# Patient Record
Sex: Male | Born: 1995 | Race: Black or African American | Hispanic: No | Marital: Single | Smoking: Never smoker
Health system: Southern US, Community
[De-identification: ages and names within clinical notes are randomized; demographics above are authoritative.]

## PROBLEM LIST (undated history)

## (undated) ENCOUNTER — Emergency Department (HOSPITAL_BASED_OUTPATIENT_CLINIC_OR_DEPARTMENT_OTHER): Admission: EM | Payer: Medicaid Other | Source: Home / Self Care

---
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
## 2016-07-06 ENCOUNTER — Encounter (HOSPITAL_BASED_OUTPATIENT_CLINIC_OR_DEPARTMENT_OTHER): Payer: Self-pay

## 2016-07-06 ENCOUNTER — Emergency Department (HOSPITAL_BASED_OUTPATIENT_CLINIC_OR_DEPARTMENT_OTHER)
Admission: EM | Admit: 2016-07-06 | Discharge: 2016-07-06 | Disposition: A | Discharge status: Home / Self Care | Payer: Self-pay | Attending: Emergency Medicine | Admitting: Emergency Medicine

## 2016-07-06 ENCOUNTER — Emergency Department (HOSPITAL_BASED_OUTPATIENT_CLINIC_OR_DEPARTMENT_OTHER): Payer: Self-pay

## 2016-07-06 DIAGNOSIS — G8929 Other chronic pain: Secondary | ICD-10-CM | POA: Insufficient documentation

## 2016-07-06 DIAGNOSIS — M25511 Pain in right shoulder: Secondary | ICD-10-CM | POA: Insufficient documentation

## 2016-07-06 NOTE — ED Triage Notes (Signed)
C/o cont'd right shoulder pain r/t WC injury/ claim from Jun 15 2016-pt NAD-steady gait

## 2016-07-06 NOTE — ED Provider Notes (Signed)
[facility]   CSN: [id number] Arrival date & time: 07/06/16  1407     History   Chief Complaint Chief Complaint  Patient presents with  . Shoulder Pain    HPI Milinda Caveyler Laible is a 20 y.o. male.  HPI  20 y.o. male presents to the Emergency Department today complaining of right shoulder pain since Nov. 15th. Notes being at work and reaching for heavy machinery when he felt a pulling sensation in his arm. Went to UC that day and dx with sprain. No dislocation. No fracture. Since then, pt has had persistent shoulder pain with movement. No pain at rest. No numbness/tingling. No fevers. OTC with minimal relief. Pt presents with mother due to delay in seeing [facility]CP [facility]. No other symptoms noted.     History reviewed. No pertinent past medical history.  There are no active problems to display for this patient.   History reviewed. No pertinent surgical history.     Home Medications    Prior to Admission medications   Not on File    Family History No family history on file.  Social History Social History  Substance Use Topics  . Smoking status: Never Smoker  . Smokeless tobacco: Never Used  . Alcohol use No     Allergies   Patient has no known allergies.   Review of Systems Review of Systems  Constitutional: Negative for fever.  Gastrointestinal: Negative for nausea and vomiting.  Musculoskeletal: Positive for arthralgias.   Physical Exam Updated Vital Signs BP 129/90 (BP Location: Left Arm)   Pulse 62   Temp 97.1 F (36.2 C) (Oral)   Resp 18   Ht 5\' 7"  (1.702 m)   Wt 77.8 kg   SpO2 100%   BMI 26.85 kg/m   Physical Exam  Constitutional: He is oriented to person, place, and time. Vital signs are normal. He appears well-developed and well-nourished.  HENT:  Head: Normocephalic.  Right Ear: Hearing normal.  Left Ear: Hearing normal.  Eyes: Conjunctivae and EOM are normal. Pupils are equal, round, and reactive to  light.  Neck: Normal range of motion. Neck supple.  Cardiovascular: Normal rate and regular rhythm.   Pulmonary/Chest: Effort normal.  Musculoskeletal:  Right Shoulder Pain on passive internal/external rotation. Positive Neers. Pt able to actively ROM without difficulty, although it is painful. No crepitus. NVI. Distal pulses appreciated.  Neurological: He is alert and oriented to person, place, and time.  Skin: Skin is warm and dry.  Psychiatric: He has a normal mood and affect. His speech is normal and behavior is normal. Thought content normal.  Nursing note and vitals reviewed.  ED Treatments / Results  Labs (all labs ordered are listed, but only abnormal results are displayed) Labs Reviewed - No data to display  EKG  EKG Interpretation None      Radiology No results found.  Procedures Procedures (including critical care time)  Medications Ordered in ED Medications - No data to display   Initial Impression / Assessment and Plan / ED Course  I have reviewed the triage vital signs and the nursing notes.  Pertinent labs & imaging results that were available during my care of the patient were reviewed by me and considered in my medical decision making (see chart for details).  Clinical Course    Final Clinical Impressions(s) / ED Diagnoses   {I have reviewed and evaluated the relevant imaging studies.  {I have reviewed the relevant previous healthcare records.  {  I obtained HPI from historian.   ED Course:  Assessment: Patient X-Ray negative for obvious fracture or dislocation. Likely rotator cuff sprain. Pt advised to follow up with orthopedics. Patient with shoulder sling at home, conservative therapy recommended and discussed. Patient will be discharged home & is agreeable with above plan. Returns precautions discussed. Pt appears safe for discharge.  Disposition/Plan:  DC Home Additional Verbal discharge instructions given and discussed with patient.  Pt  Instructed to f/u with PCP in the next week for evaluation and treatment of symptoms. Return precautions given Pt acknowledges and agrees with plan  Supervising Physician Vanetta MuldersScott Zackowski, MD  Final diagnoses:  Chronic right shoulder pain    New Prescriptions New Prescriptions   No medications on file     Audry Piliyler Kalev Temme, PA-C 07/06/16 1555    Loren Raceravid Yelverton, MD 07/09/16 [phone]

## 2016-07-06 NOTE — ED Notes (Signed)
Family at bedside. 

## 2016-07-06 NOTE — Discharge Instructions (Signed)
Please read and follow all provided instructions.  Your diagnoses today include:  1. Chronic right shoulder pain     Tests performed today include: Vital signs. See below for your results today.   Medications prescribed:  Take as prescribed   Home care instructions:  Follow any educational materials contained in this packet.  Follow-up instructions: Please follow-up with Orthopedics for further evaluation of symptoms and treatment   Return instructions:  Please return to the Emergency Department if you do not get better, if you get worse, or new symptoms OR  - Fever (temperature greater than 101.8F)  - Bleeding that does not stop with holding pressure to the area    -Severe pain (please note that you may be more sore the day after your accident)  - Chest Pain  - Difficulty breathing  - Severe nausea or vomiting  - Inability to tolerate food and liquids  - Passing out  - Skin becoming red around your wounds  - Change in mental status (confusion or lethargy)  - New numbness or weakness    Please return if you have any other emergent concerns.  Additional Information:  Your vital signs today were: BP 129/90 (BP Location: Left Arm)    Pulse 62    Temp 97.1 F (36.2 C) (Oral)    Resp 18    Ht 5\' 7"  (1.702 m)    Wt 77.8 kg    SpO2 100%    BMI 26.85 kg/m  If your blood pressure (BP) was elevated above 135/85 this visit, please have this repeated by your doctor within one month. ---------------

## 2016-07-08 ENCOUNTER — Encounter: Payer: Self-pay | Admitting: Family Medicine

## 2016-07-08 ENCOUNTER — Ambulatory Visit (INDEPENDENT_AMBULATORY_CARE_PROVIDER_SITE_OTHER): Payer: Self-pay | Admitting: Family Medicine

## 2016-07-08 VITALS — BP 127/81 | HR 86 | Ht 67.0 in | Wt 171.0 lb

## 2016-07-08 DIAGNOSIS — S4991XA Unspecified injury of right shoulder and upper arm, initial encounter: Secondary | ICD-10-CM

## 2016-07-08 NOTE — Patient Instructions (Addendum)
You have a shoulder subluxation (near dislocation) - this leads to rotator cuff strain/spasms and possible labral tear. Start physical therapy and do home exercises on days you don't go to therapy. Icing 15 minutes at a time 3-4 times a day. Ibuprofen 600mg  three times a day with food OR aleve 2 tabs twice a day with food for pain and inflammation. Follow up with me on or after December 27th. If not improving would go ahead with MR arthrogram of this shoulder.

## 2016-07-11 DIAGNOSIS — S4991XD Unspecified injury of right shoulder and upper arm, subsequent encounter: Secondary | ICD-10-CM | POA: Insufficient documentation

## 2016-07-11 NOTE — Assessment & Plan Note (Signed)
sustained when reaching inferiorly at work.  Consistent with shoulder subluxation leading to rotator cuff strain, possible labral tear based on exam.  Start physical therapy, home exercises, icing.  Ibuprofen or aleve if needed.  F/u when 6 weeks out from injury.  Consider MR arthrogram if not improving.

## 2016-07-11 NOTE — Progress Notes (Signed)
PCP: Pcp Not In System  Subjective:   HPI: Patient is a 20 y.o. male here for right shoulder injury.  Patient reports on 11/15 while at work he was reaching downwards for a tool and felt a pop in right shoulder. Arm went numb especially around shoulder area. Since then has had pain with all movements of the shoulder. Pain level 4/10, sharp. No prior issues with this shoulder. Is right handed. Went to 1-2 visits of PT before workers comp denied this. No skin changes, current numbness. No swelling or bruising.  No past medical history on file.  No current outpatient prescriptions on file prior to visit.   No current facility-administered medications on file prior to visit.     No past surgical history on file.  No Known Allergies  Social History   Social History  . Marital status: Single    Spouse name: N/A  . Number of children: N/A  . Years of education: N/A   Occupational History  . Not on file.   Social History Main Topics  . Smoking status: Never Smoker  . Smokeless tobacco: Never Used  . Alcohol use No  . Drug use: No  . Sexual activity: Not on file   Other Topics Concern  . Not on file   Social History Narrative  . No narrative on file    No family history on file.  BP 127/81   Pulse 86   Ht 5\' 7"  (1.702 m)   Wt 171 lb (77.6 kg)   BMI 26.78 kg/m   Review of Systems: See HPI above.     Objective:  Physical Exam:  Gen: NAD, comfortable in exam room  Right shoulder: No swelling, ecchymoses.  No gross deformity. Mild anterior and posterior shoulder tenderness. Full ER.  Flex and abduct to 90 degrees actively, painful arc. Positive Hawkins, Neers. Negative Yergasons. Strength 5/5 with empty can and resisted internal/external rotation.  Pain all motions. Mild pain with apprehension. Negative sulcus. Positive o'briens. NV intact distally.  Left shoulder: FROM without pain   Assessment & Plan:  1. Right shoulder injury - sustained  when reaching inferiorly at work.  Consistent with shoulder subluxation leading to rotator cuff strain, possible labral tear based on exam.  Start physical therapy, home exercises, icing.  Ibuprofen or aleve if needed.  F/u when 6 weeks out from injury.  Consider MR arthrogram if not improving.

## 2016-07-15 ENCOUNTER — Ambulatory Visit: Payer: Medicaid Other | Attending: Family Medicine | Admitting: Physical Therapy

## 2016-07-15 DIAGNOSIS — M25511 Pain in right shoulder: Secondary | ICD-10-CM | POA: Insufficient documentation

## 2016-07-15 DIAGNOSIS — M6281 Muscle weakness (generalized): Secondary | ICD-10-CM

## 2016-07-15 DIAGNOSIS — M25611 Stiffness of right shoulder, not elsewhere classified: Secondary | ICD-10-CM | POA: Insufficient documentation

## 2016-07-15 DIAGNOSIS — R293 Abnormal posture: Secondary | ICD-10-CM

## 2016-07-15 NOTE — Therapy (Signed)
[facility] [place] [address]  [address] [place], [place], [address] Phone: [phone]   Fax:  [phone]  Physical Therapy Evaluation  Patient Details  Name: Joel Martinez MRN: [id number] Date of Birth: 09/02/1995 Referring Provider: Dr. Norton BlizzardShane Hudnall  Encounter Date: 07/15/2016      PT End of Session - 07/15/16 1159    Visit Number 1   Number of Visits 16   Date for PT Re-Evaluation 09/16/16   PT Start Time 1015   PT Stop Time 1058   PT Time Calculation (min) 43 min   Activity Tolerance Patient tolerated treatment well   Behavior [facility] Therapy [facility] for tasks assessed/performed      No past medical history on file.  No past surgical history on file.  There were no vitals filed for this visit.       Subjective Assessment - 07/15/16 1017    Subjective Patient reporting R shoulder pain. Patient was reaching for drill at work - felt pop, was unable to then lift drill. Patient denies numbness and tingling into R UE. Patient reporitng pain with all active movements. Dr. Pearletha ForgeHudnall has given patient theraband and exericses, which patient has been complaint with. Issue has been going on for approx 1 month.    Patient is accompained by: Family member   Limitations Lifting   Diagnostic tests xray - patient unsure of results   Patient Stated Goals get shoulder stronger, reduce pain   Currently in Pain? Yes   Pain Score 4    Pain Location Shoulder   Pain Orientation Right   Pain Descriptors / Indicators Aching;Tightness   Pain Type Acute pain   Pain Onset 1 to 4 weeks ago   Pain Frequency Intermittent   Aggravating Factors  reaching - all directions, sleeping on R shoulder, lifting   Pain Relieving Factors heat   Effect of Pain on Daily Activities unable to work            [facility] PT Assessment - 07/15/16 0001      Assessment   Medical Diagnosis R shoulder injury - possible subluxation   Referring Provider Dr. Norton BlizzardShane  Hudnall   Onset Date/Surgical Date 06/15/16   Hand Dominance Right   Next MD Visit 08/03/16   Prior Therapy yes - shoulder     Balance Screen   Has the patient fallen in the past 6 months No   Has the patient had a decrease in activity level because of a fear of falling?  No   Is the patient reluctant to leave their home because of a fear of falling?  No     Home [employer]   Living Environment Private residence   Living Arrangements Parent   Type of Home Apartment     Prior Function   Level of Independence Independent   Vocation Full time employment   Vocation Requirements drills, heavy use of UE   Leisure video games     Cognition   Overall Cognitive Status Within Functional Limits for tasks assessed     Observation/Other Assessments   Focus on Therapeutic Outcomes (FOTO)  Shoulder: 47 (53% limited, predicted 28% limited)     Posture/Postural Control   Posture/Postural Control Postural limitations   Postural Limitations Rounded Shoulders;Forward head     ROM / Strength   AROM / PROM / Strength AROM;PROM;Strength     AROM   Overall AROM Comments L shoulder Full AROM with no pain   AROM Assessment  Site Shoulder   Right/Left Shoulder Right   Right Shoulder Flexion 75 Degrees   Right Shoulder ABduction 93 Degrees   Right Shoulder Internal Rotation --  functional to approx T8-T10   Right Shoulder External Rotation --  Functional to R UT     PROM   PROM Assessment Site Shoulder   Right/Left Shoulder Right   Right Shoulder Flexion 148 Degrees   Right Shoulder ABduction 139 Degrees   Right Shoulder Internal Rotation 79 Degrees   Right Shoulder External Rotation 82 Degrees     Strength   Strength Assessment Site Shoulder;Elbow   Right/Left Shoulder Right;Left   Right Shoulder Flexion 3+/5   Right Shoulder ABduction 3+/5   Right Shoulder Internal Rotation 3+/5   Right Shoulder External Rotation 3/5   Left Shoulder Flexion 5/5   Left Shoulder ABduction 5/5   Left  Shoulder Internal Rotation 5/5   Left Shoulder External Rotation 5/5   Right/Left Elbow Right;Left   Right Elbow Flexion 3+/5   Right Elbow Extension 4-/5   Left Elbow Flexion 5/5   Left Elbow Extension 5/5     Special Tests    Special Tests Rotator Cuff Impingement;Laxity/Instability Tests;Biceps/Labral Tests   Rotator Cuff Impingment tests Leanord AsalHawkins- Kennedy test;Empty Can test;Painful Arc of Motion   Laxity/Instability  Sulcus sign test at 0 degrees   Biceps/Labral tests Speeds Test     Hawkins-Kennedy test   Findings Positive   Side Right     Empty Can test   Findings Negative   Side Right     Painful Arc of Motion   Findings Positive   Side Right     Sulcus Sign At 0 Degrees   Findings Negative   Side Right     O'Brien's Test   Findings Positive   Side Right     Speeds test   findings Positive   Side Right                           PT Education - 07/15/16 1158    Education provided Yes   Education Details exam findins, POC, HEP   Person(s) Educated Patient   Methods Explanation;Demonstration;Handout   Comprehension Verbalized understanding;Returned demonstration;Need further instruction          PT Short Term Goals - 07/15/16 1206      PT SHORT TERM GOAL #1   Title Patient to be independent with HEP (08/19/16)   Status New     PT SHORT TERM GOAL #2   Title Patient to improve R shoulder PROM equal to that of L shoulder without pain (08/19/16)           PT Long Term Goals - 07/15/16 1205      PT LONG TERM GOAL #1   Title Patient to be independent with advanced HEP (09/16/16)   Status New     PT LONG TERM GOAL #2   Title Patient to demonstrate AROM of R shoulder equal to that of L without an increase in pain (09/16/16)   Status New     PT LONG TERM GOAL #3   Title Patient to improve R shoulder/elbow strength to >/= 4+/5 with no increase in pain to allow for return to work (09/16/16)   Status New     PT LONG TERM GOAL #4    Title Patient to demonstrate proper postural alignment with neutral spine for improed biomechanics of R shoulder complex (09/16/16)   Status  New               Plan - 07/15/16 1159    Clinical Impression Statement Patient is a 20 y/o male presenting to OPPT today for low complexity eval regarding R shoulder pain. Patient reporting that he was at work when he reached down for an object, and heard/felt a "pop" in his shoulder; immediate onset of pain, numbness, and inability to lift. Patient today demonstrating poor posture, as well as reduced AROM and PROM with pain limiting as well as reduced strength of R shoulder, however patient with give-way weakness with all MMT. Patient with positive test for impingement as well as postive test for biceps/labral involvement, however, difficult to truly assess due to patients pain during evaluation. Patient heavily guarded with all passive motions of R shoulder with required VC to allow for passive movement. Patient to benefit from skilled PT intervention to addess the above listed deficits to alloe for improved functional use of R UE.    Rehab Potential Good   PT Frequency 2x / week   PT Duration 8 weeks   PT Treatment/Interventions ADLs/Self Care Home Management;Cryotherapy;Electrical Stimulation;Moist Heat;Ultrasound;Therapeutic exercise;Therapeutic activities;Patient/family education;Manual techniques;Passive range of motion;Vasopneumatic Device;Taping;Dry needling   Consulted and Agree with Plan of Care Patient      Patient will benefit from skilled therapeutic intervention in order to improve the following deficits and impairments:  Decreased activity tolerance, Decreased range of motion, Decreased strength, Improper body mechanics, Increased muscle spasms, Increased fascial restricitons, Impaired UE functional use, Pain  Visit Diagnosis: Acute pain of right shoulder - Plan: PT plan of care cert/re-cert  Stiffness of right shoulder, not elsewhere  classified - Plan: PT plan of care cert/re-cert  Abnormal posture - Plan: PT plan of care cert/re-cert  Muscle weakness (generalized) - Plan: PT plan of care cert/re-cert     Problem List Patient Active Problem List   Diagnosis Date Noted  . Right shoulder injury, initial encounter 07/11/2016      Kipp Laurence, PT, DPT 07/15/16 12:11 PM    [facility] [address]  [address] [place], [place], [address] Phone: [phone]   Fax:  [phone]  Name: Bowie Delia MRN: [id number] Date of Birth: August 18, 1995

## 2016-07-15 NOTE — Patient Instructions (Signed)
Cane Exercise: Flexion    Lie on back, holding cane above chest. Keeping arms as straight as possible, lower cane toward floor beyond head. Hold __5-10__ seconds. Repeat __15__ times. Do _2___ sessions per day.    Abduction (Eccentric) - Active (Cane)    Lift cane out to side with unaffected arm. Avoid hiking shoulder. Keep palm relaxed. Slowly lower affected arm for 3-5 seconds. _15__ reps per set, __2_ sets per day. Hold 5-10 seconds at end range     SHOULDER: External Rotation - Supine (Cane)    Hold cane with both hands. Rotate arm away from body. Keep elbow on floor and next to body. _15__ reps per set, _2__ sets per day. Hold at end range 5-10 seconds      Scapular Retraction (Standing)    With arms at sides, pinch shoulder blades together. Repeat __15__ times per set. Do _3_ sets per session.     Flexibility: Upper Trapezius Stretch    Gently grasp right side of head while reaching behind back with other hand. Tilt head away until a gentle stretch is felt. Hold _30___ seconds. Repeat __3__ times per set. Do 2 times a day.

## 2016-07-22 ENCOUNTER — Ambulatory Visit: Payer: Medicaid Other | Admitting: Physical Therapy

## 2016-07-22 DIAGNOSIS — M25511 Pain in right shoulder: Secondary | ICD-10-CM

## 2016-07-22 DIAGNOSIS — R293 Abnormal posture: Secondary | ICD-10-CM

## 2016-07-22 DIAGNOSIS — M25611 Stiffness of right shoulder, not elsewhere classified: Secondary | ICD-10-CM

## 2016-07-22 DIAGNOSIS — M6281 Muscle weakness (generalized): Secondary | ICD-10-CM

## 2016-07-22 NOTE — Patient Instructions (Signed)
Horizontal Abduction (Resistive Band)    With arms at shoulder level, keep elbows straight. Using other arm as anchor, pull involved arm outward. Hold __3-5__ seconds. Repeat _15___ times. Band in hands (not on elbows)    External Rotation (Eccentric), (Resistance Band)    Quickly pull band outward with forearm of affected arm. Keep elbows at sides, wrists neutral. Slowly return to start for 3-5 seconds. Use ___red_____ resistance band.  _15__ reps per set ** Thumbs point out - rotate shoulders nad squeeze shoulder blades    Resistive Band Rowing    With resistive band anchored in door, grasp both ends. Keeping elbows bent, pull back, squeezing shoulder blades together. Hold __3-5__ seconds. Repeat __15__ times.  Squeeze shoulder blades   Arm: Internal Rotation    Redband in door - elbow by side - rotate palm into stomach x 15 reps    Arm: External Rotation    Red band in door - elbow by side - rotate shoulder out x 15 reps

## 2016-07-22 NOTE — Therapy (Signed)
[facility] [address]  [address] [place], [place], [address] Phone: [phone]   Fax:  [phone]  Physical Therapy Treatment  Patient Details  Name: Joel Martinez MRN: [id number] Date of Birth: 04/08/1996 Referring Provider: Dr. Norton BlizzardShane Hudnall  Encounter Date: 07/22/2016      PT End of Session - 07/22/16 1021    Visit Number 2   Number of Visits 17   Authorization Type Medicaid   Authorization Time Period 07/22/16 - 09/15/16   Authorization - Visit Number 1   Authorization - Number of Visits 16   PT Start Time 1018   PT Stop Time 1101   PT Time Calculation (min) 43 min   Activity Tolerance Patient tolerated treatment well   Behavior During Therapy [facility] for tasks assessed/performed      No past medical history on file.  No past surgical history on file.  There were no vitals filed for this visit.      Subjective Assessment - 07/22/16 1022    Subjective Patient reports good compliance with HEP - feels like he can move arm a little more.   Patient is accompained by: Family member   Diagnostic tests xray - patient unsure of results   Patient Stated Goals get shoulder stronger, reduce pain   Currently in Pain? Yes   Pain Score 2    Pain Location Shoulder   Pain Orientation Right   Pain Descriptors / Indicators Sore   Pain Type Acute pain   Pain Onset 1 to 4 weeks ago   Pain Frequency Intermittent   Aggravating Factors  reaching - all directions, sleeping on R shoulder, lifting   Pain Relieving Factors heat   Effect of Pain on Daily Activities unable to work                         [facility] - 07/22/16 1024      Exercises   Exercises Shoulder     Shoulder Exercises: Supine   Horizontal ABduction AROM;Both;10 reps;Theraband   Theraband Level (Shoulder Horizontal ABduction) Level 2 (Red)   External Rotation Strengthening;Both;10 reps;Theraband   Theraband Level  (Shoulder External Rotation) Level 2 (Red)   External Rotation Limitations with scap squeeze     Shoulder Exercises: Sidelying   External Rotation Strengthening;Right;10 reps;Weights   External Rotation Weight (lbs) 2   External Rotation Limitations 2 sets   ABduction Strengthening;Right;10 reps;Weights   ABduction Weight (lbs) 2   ABduction Limitations 2 sests     Shoulder Exercises: Standing   External Rotation Strengthening;Right;15 reps;Theraband   Theraband Level (Shoulder External Rotation) Level 2 (Red)   Internal Rotation Strengthening;Both;15 reps;Theraband   Theraband Level (Shoulder Internal Rotation) Level 2 (Red)   Row Strengthening;Both;15 reps;Theraband   Theraband Level (Shoulder Row) Level 2 (Red)     Shoulder Exercises: ROM/Strengthening   UBE (Upper Arm Bike) level 2.5 x 6 minutes (3/3)     Manual Therapy   Manual Therapy Passive ROM   Passive ROM PROM of R shoulder into flexion, abduction, ER, and IR with near full motion in all planes - slight anterior R shoulder pain with all stretching                PT Education - 07/22/16 1128    Education provided Yes   Education Details HEP progression   Person(s) Educated Patient   Methods Explanation;Demonstration;Handout   Comprehension Verbalized understanding;Returned demonstration;Need further  instruction          PT Short Term Goals - 07/15/16 1206      PT SHORT TERM GOAL #1   Title Patient to be independent with HEP (08/19/16)   Status New     PT SHORT TERM GOAL #2   Title Patient to improve R shoulder PROM equal to that of L shoulder without pain (08/19/16)           PT Long Term Goals - 07/15/16 1205      PT LONG TERM GOAL #1   Title Patient to be independent with advanced HEP (09/16/16)   Status New     PT LONG TERM GOAL #2   Title Patient to demonstrate AROM of R shoulder equal to that of L without an increase in pain (09/16/16)   Status New     PT LONG TERM GOAL #3   Title  Patient to improve R shoulder/elbow strength to >/= 4+/5 with no increase in pain to allow for return to work (09/16/16)   Status New     PT LONG TERM GOAL #4   Title Patient to demonstrate proper postural alignment with neutral spine for improed biomechanics of R shoulder complex (09/16/16)   Status New               Plan - 07/22/16 1024    Clinical Impression Statement Patient today with subjective reports of feeling better since last visit with improved ability to move shoulder and have functional use of that shoulder - such as putting on hoodie. Patient today with near full PROM of R shoulder in all planes with slight anterior shoulder pain with overpressure that quickly resolves. Patient initiating more strengthening tasks today with ability to perform all activities with no increase in pain. Patient to continue to benefit from PT to maximize functional use of R UE.    PT Treatment/Interventions ADLs/Self Care Home Management;Cryotherapy;Electrical Stimulation;Moist Heat;Ultrasound;Therapeutic exercise;Therapeutic activities;Patient/family education;Manual techniques;Passive range of motion;Vasopneumatic Device;Taping;Dry needling   Consulted and Agree with Plan of Care Patient      Patient will benefit from skilled therapeutic intervention in order to improve the following deficits and impairments:  Decreased activity tolerance, Decreased range of motion, Decreased strength, Improper body mechanics, Increased muscle spasms, Increased fascial restricitons, Impaired UE functional use, Pain  Visit Diagnosis: Acute pain of right shoulder  Stiffness of right shoulder, not elsewhere classified  Abnormal posture  Muscle weakness (generalized)     Problem List Patient Active Problem List   Diagnosis Date Noted  . Right shoulder injury, initial encounter 07/11/2016      Kipp LaurenceStephanie R Aaron, PT, DPT 07/22/16 11:32 AM    [facility]  [place] [address]  [address] [place], [place], [address] Phone: [phone]   Fax:  [phone]  Name: Joel Martinez MRN: [id number] Date of Birth: 06/12/1996

## 2016-07-27 ENCOUNTER — Ambulatory Visit: Payer: Medicaid Other

## 2016-07-27 DIAGNOSIS — R293 Abnormal posture: Secondary | ICD-10-CM

## 2016-07-27 DIAGNOSIS — M25611 Stiffness of right shoulder, not elsewhere classified: Secondary | ICD-10-CM

## 2016-07-27 DIAGNOSIS — M25511 Pain in right shoulder: Secondary | ICD-10-CM

## 2016-07-27 DIAGNOSIS — M6281 Muscle weakness (generalized): Secondary | ICD-10-CM

## 2016-07-27 NOTE — Therapy (Signed)
[facility] [address]  [address] [place], [place], [address] Phone: [phone]   Fax:  [phone]  Physical Therapy Treatment  Patient Details  Name: Joel Martinez Guard MRN: [id number] Date of Birth: 06/17/1996 Referring Provider: Dr. Norton BlizzardShane Hudnall  Encounter Date: 07/27/2016      PT End of Session - 07/27/16 0948    Visit Number 3   Number of Visits 17   Authorization Type Medicaid   Authorization Time Period 07/22/16 - 09/15/16   Authorization - Visit Number 2   Authorization - Number of Visits 16   PT Start Time 0945  Pt. arriving 15 min late to appointment    PT Stop Time 1015   PT Time Calculation (min) 30 min   Activity Tolerance Patient tolerated treatment well   Behavior During Therapy [facility] for tasks assessed/performed      No past medical history on file.  No past surgical history on file.  There were no vitals filed for this visit.      Subjective Assessment - 07/27/16 0947    Subjective Pt. reporting R shoulder mildly sore following last treatment.     Patient Stated Goals get shoulder stronger, reduce pain   Currently in Pain? No/denies   Pain Score 0-No pain   Multiple Pain Sites No            [facility] Adult PT Treatment/Exercise - 07/27/16 0954      Shoulder Exercises: Supine   Other Supine Exercises All wand activities AAROM flexion, abduction, ER reviewed with 5 reps to check for proper technique  pt. requiring some verbal cueing for proper technique withER   Other Supine Exercises --     Shoulder Exercises: Sidelying   External Rotation Strengthening;Right;Weights;20 reps   External Rotation Weight (lbs) 2   External Rotation Limitations 1 set    ABduction Strengthening;Right;Weights;15 reps   ABduction Weight (lbs) 2   ABduction Limitations 1 sets    Other Sidelying Exercises 3# shoulder circles CW, CCW x 20 reps each way      Shoulder Exercises: Standing   External Rotation  Strengthening;Right;15 reps;Theraband   Theraband Level (Shoulder External Rotation) Level 3 (Green)   Internal Rotation Strengthening;Both;15 reps;Theraband   Theraband Level (Shoulder Internal Rotation) Level 3 (Green)   Row Strengthening;Both;15 reps;Theraband  5" hold    Theraband Level (Shoulder Row) Level 4 (Blue)                  PT Short Term Goals - 07/27/16 0949      PT SHORT TERM GOAL #1   Title Patient to be independent with HEP (08/19/16)   Status On-going     PT SHORT TERM GOAL #2   Title Patient to improve R shoulder PROM equal to that of L shoulder without pain (08/19/16)           PT Long Term Goals - 07/27/16 0949      PT LONG TERM GOAL #1   Title Patient to be independent with advanced HEP (09/16/16)   Status On-going     PT LONG TERM GOAL #2   Title Patient to demonstrate AROM of R shoulder equal to that of L without an increase in pain (09/16/16)   Status On-going     PT LONG TERM GOAL #3   Title Patient to improve R shoulder/elbow strength to >/= 4+/5 with no increase in pain to allow for return to work (09/16/16)   Status On-going  PT LONG TERM GOAL #4   Title Patient to demonstrate proper postural alignment with neutral spine for improed biomechanics of R shoulder complex (09/16/16)   Status On-going               Plan - 07/27/16 [id number]    Clinical Impression Statement Pt. arriving 15 min late to therapy today thus treatment abbreviated.  Strengthening therex mildy progressed today with pt. tolerating this well.  Pt. reporting 5/10 pain with green TB resisted shoulder ER however pt. motion presentation inconsistent with pain report.  Pt. only reporting pain today when questioned by therapist and failed to demonstrate appropriate muscular guarding or apprehension for pain reports.  Updated HEP reviewed with pt. today however poor technique with demonstration of ER/IR with band resistance.  Pt. requiring cueing for proper technique with  ER today and would benefit from future HEP review to improve technique and adherence.     PT Treatment/Interventions ADLs/Self Care Home Management;Cryotherapy;Electrical Stimulation;Moist Heat;Ultrasound;Therapeutic exercise;Therapeutic activities;Patient/family education;Manual techniques;Passive range of motion;Vasopneumatic Device;Taping;Dry needling      Patient will benefit from skilled therapeutic intervention in order to improve the following deficits and impairments:  Decreased activity tolerance, Decreased range of motion, Decreased strength, Improper body mechanics, Increased muscle spasms, Increased fascial restricitons, Impaired UE functional use, Pain  Visit Diagnosis: Acute pain of right shoulder  Stiffness of right shoulder, not elsewhere classified  Abnormal posture  Muscle weakness (generalized)     Problem List Patient Active Problem List   Diagnosis Date Noted  . Right shoulder injury, initial encounter 07/11/2016    Kermit BaloMicah Kaylena Pacifico, PTA 07/27/16 11:37 AM  [facility] [place] [address]  [address] [place], [place], [address] Phone: [phone]   Fax:  [phone]  Name: Joel Martinez Greenspan MRN: [id number] Date of Birth: 09/04/1995

## 2016-08-03 ENCOUNTER — Ambulatory Visit: Payer: Medicaid Other | Attending: Family Medicine | Admitting: Physical Therapy

## 2016-08-03 ENCOUNTER — Ambulatory Visit (INDEPENDENT_AMBULATORY_CARE_PROVIDER_SITE_OTHER): Payer: Self-pay | Admitting: Family Medicine

## 2016-08-03 ENCOUNTER — Encounter: Payer: Self-pay | Admitting: Family Medicine

## 2016-08-03 DIAGNOSIS — M25611 Stiffness of right shoulder, not elsewhere classified: Secondary | ICD-10-CM | POA: Insufficient documentation

## 2016-08-03 DIAGNOSIS — M25511 Pain in right shoulder: Secondary | ICD-10-CM | POA: Insufficient documentation

## 2016-08-03 DIAGNOSIS — R293 Abnormal posture: Secondary | ICD-10-CM | POA: Insufficient documentation

## 2016-08-03 DIAGNOSIS — S4991XD Unspecified injury of right shoulder and upper arm, subsequent encounter: Secondary | ICD-10-CM

## 2016-08-03 DIAGNOSIS — M6281 Muscle weakness (generalized): Secondary | ICD-10-CM | POA: Insufficient documentation

## 2016-08-03 NOTE — Therapy (Addendum)
[facility] [address]  [place], [place], [address] Phone: [phone]   Fax:  [phone]  Physical Therapy Treatment  Patient Details  Name: Joel Martinez MRN: [id number] Date of Birth: 10/13/1995 Referring Provider: Dr. Karlton Lemon  Encounter Date: 08/03/2016      PT End of Session - 08/03/16 1015    Visit Number 4   Number of Visits 17   Date for PT Re-Evaluation 09/16/16   Authorization Type Medicaid   Authorization Time Period 07/22/16 - 09/15/16   Authorization - Visit Number 3   Authorization - Number of Visits 16   PT Start Time 8786   PT Stop Time 1059   PT Time Calculation (min) 45 min   Activity Tolerance Patient tolerated treatment well   Behavior During Therapy [facility] for tasks assessed/performed      No past medical history on file.  No past surgical history on file.  There were no vitals filed for this visit.      Subjective Assessment - 08/03/16 1013    Subjective Patient reporting full motion of R shoulder - no pain. Has remained compliant with HEP   Patient is accompained by: Family member   Diagnostic tests xray - patient unsure of results   Patient Stated Goals get shoulder stronger, reduce pain   Currently in Pain? No/denies   Pain Score 0-No pain            [facility] PT Assessment - 08/03/16 0001      AROM   Overall AROM Comments AROM R shoulder equal to L - no pain     PROM   Overall PROM  --  PROM R shoulder equal to L - no pain     Strength   Strength Assessment Site Shoulder;Elbow   Right/Left Shoulder Right;Left   Right Shoulder ABduction 4+/5   Right Shoulder Internal Rotation 4+/5   Right Shoulder External Rotation 4/5   Left Shoulder Flexion 5/5   Left Shoulder ABduction 5/5   Left Shoulder Internal Rotation 5/5   Left Shoulder External Rotation 5/5   Right/Left Elbow Right;Left   Right Elbow Flexion 5/5   Right Elbow Extension 5/5   Left Elbow Flexion 5/5    Left Elbow Extension 5/5                     [facility] Adult PT Treatment/Exercise - 08/03/16 1016      Shoulder Exercises: Prone   Other Prone Exercises I's and T's on green physioball - 15 reps with 2#     Shoulder Exercises: Standing   External Rotation Strengthening;Right;15 reps;Theraband  1 set elbow by side; 1 set overhead   Theraband Level (Shoulder External Rotation) Level 3 (Green)   Internal Rotation Strengthening;Right;15 reps;Theraband   Theraband Level (Shoulder Internal Rotation) Level 3 (Green)  1 set elbow by side; 1 set overhead   Other Standing Exercises PNF D1/D2 - blue tband x 15 reps     Shoulder Exercises: ROM/Strengthening   UBE (Upper Arm Bike) level 5 x 6 minutes (3/3)   Cybex Row 15 reps   Cybex Row Limitations 35# - 15 reps at narrow grip   Rhythmic Stabilization, Supine patient supine with yellow medball - CW/CCW x 20; ABC's                   PT Short Term Goals - 08/03/16 1023      PT SHORT TERM GOAL #  1   Title Patient to be independent with HEP (08/19/16)   Status Achieved     PT SHORT TERM GOAL #2   Title Patient to improve R shoulder PROM equal to that of L shoulder without pain (08/19/16)   Status Achieved           PT Long Term Goals - 08/03/16 1023      PT LONG TERM GOAL #1   Title Patient to be independent with advanced HEP (09/16/16)   Status On-going     PT LONG TERM GOAL #2   Title Patient to demonstrate AROM of R shoulder equal to that of L without an increase in pain (09/16/16)   Status Achieved     PT LONG TERM GOAL #3   Title Patient to improve R shoulder/elbow strength to >/= 4+/5 with no increase in pain to allow for return to work (09/16/16)   Status On-going     PT LONG TERM GOAL #4   Title Patient to demonstrate proper postural alignment with neutral spine for improed biomechanics of R shoulder complex (09/16/16)   Status On-going               Plan - 08/03/16 1016    Clinical  Impression Statement Patient today with subjective reports of pain free motion of R shoulder into all planes with ability to return to all household tasks and ADLS. PROM and AROM of R shoulder equal to that of L with no pain in all directions. Patient performing all strengthening tasks of RTC and periscapular mm with no pain - some VC and TC required for proper form of movement to facilitate appropriate muscle response. Inclusion of overhead strengthening initiated today with good motion. Patient with MD follow-up today following treatment session to determine plan for return to work and further PT treatment.    PT Treatment/Interventions ADLs/Self Care Home Management;Cryotherapy;Electrical Stimulation;Moist Heat;Ultrasound;Therapeutic exercise;Therapeutic activities;Patient/family education;Manual techniques;Passive range of motion;Vasopneumatic Device;Taping;Dry needling   Consulted and Agree with Plan of Care Patient      Patient will benefit from skilled therapeutic intervention in order to improve the following deficits and impairments:  Decreased activity tolerance, Decreased range of motion, Decreased strength, Improper body mechanics, Increased muscle spasms, Increased fascial restricitons, Impaired UE functional use, Pain  Visit Diagnosis: Acute pain of right shoulder  Stiffness of right shoulder, not elsewhere classified  Abnormal posture  Muscle weakness (generalized)     Problem List Patient Active Problem List   Diagnosis Date Noted  . Right shoulder injury, initial encounter 07/11/2016    Lanney Gins, PT, DPT 08/03/16 11:06 AM  PHYSICAL THERAPY DISCHARGE SUMMARY  Visits from Start of Care: 4  Current functional level related to goals / functional outcomes: See above   Remaining deficits: See above   Education / Equipment: HEP  Plan: Patient agrees to discharge.  Patient goals were partially met. Patient is being discharged due to the physician's request.   ?????    Patient returning to MD for follow-up. Chart review from MD visit stating patient is able to transfer to HEP. Will see patient again in the future if PT services are needed.  Lanney Gins, PT, DPT 08/19/16 10:36 AM   [facility] [address]  [place], [place], [address] Phone: [phone]   Fax:  [phone]  Name: Zakaree Mcclenahan MRN: [id number] Date of Birth: [birth year]

## 2016-08-03 NOTE — Patient Instructions (Signed)
Do the home exercises 2-3 times a week for 4-6 more weeks. Call me if you have any problems otherwise follow up as needed.

## 2016-08-04 NOTE — Progress Notes (Signed)
PCP: Pcp Not In System  Subjective:   HPI: Patient is a 21 y.o. male here for right shoulder injury.  07/08/16: Patient reports on 11/15 while at work he was reaching downwards for a tool and felt a pop in right shoulder. Arm went numb especially around shoulder area. Since then has had pain with all movements of the shoulder. Pain level 4/10, sharp. No prior issues with this shoulder. Is right handed. Went to 1-2 visits of PT before workers comp denied this. No skin changes, current numbness. No swelling or bruising.  08/03/16: Patient reports he is doing extremely well. Did physical therapy, still doing home exercises also. No pain. No skin changes, numbness. No swelling.  No past medical history on file.  No current outpatient prescriptions on file prior to visit.   No current facility-administered medications on file prior to visit.     No past surgical history on file.  No Known Allergies  Social History   Social History  . Marital status: Single    Spouse name: N/A  . Number of children: N/A  . Years of education: N/A   Occupational History  . Not on file.   Social History Main Topics  . Smoking status: Never Smoker  . Smokeless tobacco: Never Used  . Alcohol use No  . Drug use: No  . Sexual activity: Not on file   Other Topics Concern  . Not on file   Social History Narrative  . No narrative on file    No family history on file.  BP 111/78   Pulse 76   Ht 5\' 7"  (1.702 m)   Wt 171 lb (77.6 kg)   BMI 26.78 kg/m   Review of Systems: See HPI above.     Objective:  Physical Exam:  Gen: NAD, comfortable in exam room  Right shoulder: No swelling, ecchymoses.  No gross deformity. No tenderness. FROM  Negative Hawkins, Neers. Negative Yergasons. Strength 5/5 with empty can and resisted internal/external rotation without pain. Negative apprehension. Negative o'briens. NV intact distally.  Left shoulder: FROM without pain    Assessment & Plan:  1. Right shoulder injury - Consistent with shoulder subluxation leading to rotator cuff strain, possible labral tear.  Clinically resolved with physical therapy, home exercises.  Encouraged continuing home exercises for 4-6 more weeks.  Ibuprofen or aleve if needed for soreness.  F/u prn.

## 2016-08-04 NOTE — Assessment & Plan Note (Signed)
Consistent with shoulder subluxation leading to rotator cuff strain, possible labral tear.  Clinically resolved with physical therapy, home exercises.  Encouraged continuing home exercises for 4-6 more weeks.  Ibuprofen or aleve if needed for soreness.  F/u prn.

## 2016-08-05 ENCOUNTER — Ambulatory Visit: Payer: Medicaid Other | Admitting: Physical Therapy

## 2016-08-08 ENCOUNTER — Ambulatory Visit: Payer: Medicaid Other

## 2016-08-11 ENCOUNTER — Ambulatory Visit: Payer: Medicaid Other

## 2016-08-15 ENCOUNTER — Ambulatory Visit: Payer: Medicaid Other | Admitting: Physical Therapy

## 2016-08-18 ENCOUNTER — Ambulatory Visit: Payer: Medicaid Other

## 2016-09-15 ENCOUNTER — Encounter (HOSPITAL_BASED_OUTPATIENT_CLINIC_OR_DEPARTMENT_OTHER): Payer: Self-pay

## 2016-09-15 ENCOUNTER — Emergency Department (HOSPITAL_BASED_OUTPATIENT_CLINIC_OR_DEPARTMENT_OTHER)
Admission: EM | Admit: 2016-09-15 | Discharge: 2016-09-15 | Disposition: A | Discharge status: Home / Self Care | Payer: Medicaid Other | Attending: Emergency Medicine | Admitting: Emergency Medicine

## 2016-09-15 DIAGNOSIS — J020 Streptococcal pharyngitis: Secondary | ICD-10-CM | POA: Insufficient documentation

## 2016-09-15 LAB — RAPID STREP SCREEN (MED CTR MEBANE ONLY): Streptococcus, Group A Screen (Direct): POSITIVE — AB

## 2016-09-15 MED ORDER — ACETAMINOPHEN 325 MG PO TABS
650.0000 mg | ORAL_TABLET | Freq: Once | ORAL | Status: AC
Start: 2016-09-15 — End: 2016-09-15
  Administered 2016-09-15: 650 mg via ORAL
  Filled 2016-09-15: qty 2

## 2016-09-15 MED ORDER — PENICILLIN G BENZATHINE 1200000 UNIT/2ML IM SUSP
1.2000 10*6.[IU] | Freq: Once | INTRAMUSCULAR | Status: AC
  Administered 2016-09-15: 1.2 10*6.[IU] via INTRAMUSCULAR
  Filled 2016-09-15: qty 2

## 2016-09-15 MED ORDER — NAPROXEN 250 MG PO TABS: 250.0000 mg | ORAL_TABLET | Freq: Two times a day (BID) | ORAL | 0 refills | Status: AC

## 2016-09-15 MED ORDER — DEXAMETHASONE SODIUM PHOSPHATE 10 MG/ML IJ SOLN
10.0000 mg | Freq: Once | INTRAMUSCULAR | Status: AC
  Administered 2016-09-15: 10 mg via INTRAMUSCULAR
  Filled 2016-09-15: qty 1

## 2016-09-15 NOTE — ED Provider Notes (Signed)
[facility] Note   CSN: [id number] Arrival date & time: 09/15/16  1941   By signing my name below, I, Joel Martinez, attest that this documentation has been prepared under the direction and in the presence of Will Lessly Stigler, PA-C Electronically Signed: Soijett Martinez, ED Scribe. 09/15/16. 10:09 PM.  History   Chief Complaint Chief Complaint  Patient presents with  . Sore Throat    HPI Joel Martinez is a 21 y.o. male who presents to the Emergency Department complaining of sore throat onset yesterday. Pt reports associated painful swallowing and subjective fever. Pt hasn't tried any medications for the relief of his symptoms. Pt denies chills, cough, trouble swallowing, and any other symptoms.    The history is provided by the patient. No language interpreter was used.    History reviewed. No pertinent past medical history.  Patient Active Problem List   Diagnosis Date Noted  . Right shoulder injury, subsequent encounter 07/11/2016    History reviewed. No pertinent surgical history.     Home Medications    Prior to Admission medications   Medication Sig Start Date End Date Taking? Authorizing Provider  naproxen (NAPROSYN) 250 MG tablet Take 1 tablet (250 mg total) by mouth 2 (two) times daily with a meal. 09/15/16   Joel FarrierWilliam Derrin Currey, PA-C    Family History No family history on file.  Social History Social History  Substance Use Topics  . Smoking status: Never Smoker  . Smokeless tobacco: Never Used  . Alcohol use No     Allergies   Patient has no known allergies.   Review of Systems Review of Systems  Constitutional: Positive for fever (subjective). Negative for chills.  HENT: Positive for sore throat. Negative for trouble swallowing.        +Painful swallowing  Respiratory: Negative for cough.      Physical Exam Updated Vital Signs BP 118/72 (BP Location: Left Arm)   Pulse 72   Temp 97.9 F (36.6 C) (Oral)   Resp 18   Ht 5\' 6"   (1.676 m)   Wt 78.5 kg   SpO2 97%   BMI 27.92 kg/m   Physical Exam  Constitutional: He appears well-developed and well-nourished. No distress.  Non-toxic appearing.  HENT:  Head: Normocephalic and atraumatic.  Mouth/Throat: Uvula is midline and mucous membranes are normal. No trismus in the jaw. Oropharyngeal exudate and posterior oropharyngeal edema present.  Left TM occluded by cerumen. Right TM nl. Boggy nasal turbinates bilaterally. Bilateral tonsillar hypertrophy with exudate. Uvula midline without edema. No PTA. No trismus or drooling. Tongue protrusion nl.  Eyes: Conjunctivae are normal. Pupils are equal, round, and reactive to light. Right eye exhibits no discharge. Left eye exhibits no discharge.  Neck: Neck supple.  Cardiovascular: Normal rate, regular rhythm, normal heart sounds and intact distal pulses.   Pulmonary/Chest: Effort normal and breath sounds normal. No respiratory distress.  Lungs clear to ausculation bilaterally.   Abdominal: Soft. There is no tenderness.  Lymphadenopathy:    He has no cervical adenopathy.  Neurological: He is alert. Coordination normal.  Skin: Skin is warm and dry. No rash noted. He is not diaphoretic.  Psychiatric: He has a normal mood and affect. His behavior is normal.  Nursing note and vitals reviewed.    ED Treatments / Results  DIAGNOSTIC STUDIES: Oxygen Saturation is 97% on RA, nl by my interpretation.    COORDINATION OF CARE: 10:09 PM Discussed treatment plan with pt at bedside which includes decadron injection, penicillin injection,  and pt agreed to plan.   Labs (all labs ordered are listed, but only abnormal results are displayed) Labs Reviewed  RAPID STREP SCREEN (NOT AT [facility]) - Abnormal; Notable for the following:       Result Value   Streptococcus, Group A Screen (Direct) POSITIVE (*)    All other components within normal limits    Procedures Procedures (including critical care time)  Medications Ordered in  ED Medications  penicillin g benzathine (BICILLIN LA) 1200000 UNIT/2ML injection 1.2 Million Units (not administered)  dexamethasone (DECADRON) injection 10 mg (not administered)  acetaminophen (TYLENOL) tablet 650 mg (650 mg Oral Given 09/15/16 2220)     Initial Impression / Assessment and Plan / ED Course  I have reviewed the triage vital signs and the nursing notes.  Pertinent labs that were available during my care of the patient were reviewed by me and considered in my medical decision making (see chart for details).     Pt rapid strep test positive. On exam the patient is afebrile nontoxic appearing. Pt is tolerating secretions. Presentation not concerning for peritonsillar abscess or spread of infection to deep spaces of the throat; patent airway. Pt will be treated with penicillin G and decadron while in the ED. Pt will be discharged home with naprosyn Rx. Specific return precautions discussed. Recommended PCP follow up. Pt appears safe for discharge. I advised to follow-up with their pediatrician. I advised to return to the emergency department with new or worsening symptoms or new concerns. The patient's mother verbalized understanding and agreement with plan.    Final Clinical Impressions(s) / ED Diagnoses   Final diagnoses:  Strep throat    New Prescriptions New Prescriptions   NAPROXEN (NAPROSYN) 250 MG TABLET    Take 1 tablet (250 mg total) by mouth 2 (two) times daily with a meal.   I personally performed the services described in this documentation, which was scribed in my presence. The recorded information has been reviewed and is accurate.       Joel Farrier, PA-C 09/15/16 2222    Joel Monday, MD 09/19/16 [phone]

## 2016-09-15 NOTE — ED Triage Notes (Signed)
C/o sore throat-started last night-NAD-steady gait

## 2016-11-21 ENCOUNTER — Emergency Department (HOSPITAL_BASED_OUTPATIENT_CLINIC_OR_DEPARTMENT_OTHER)
Admission: EM | Admit: 2016-11-21 | Discharge: 2016-11-21 | Disposition: A | Discharge status: Home / Self Care | Payer: Medicaid Other | Attending: Emergency Medicine | Admitting: Emergency Medicine

## 2016-11-21 ENCOUNTER — Encounter (HOSPITAL_BASED_OUTPATIENT_CLINIC_OR_DEPARTMENT_OTHER): Payer: Self-pay | Admitting: *Deleted

## 2016-11-21 DIAGNOSIS — J029 Acute pharyngitis, unspecified: Secondary | ICD-10-CM | POA: Insufficient documentation

## 2016-11-21 LAB — RAPID STREP SCREEN (MED CTR MEBANE ONLY): Streptococcus, Group A Screen (Direct): NEGATIVE

## 2016-11-21 MED ORDER — IBUPROFEN 800 MG PO TABS
800.0000 mg | ORAL_TABLET | Freq: Once | ORAL | Status: AC
  Administered 2016-11-21: 800 mg via ORAL

## 2016-11-21 MED ORDER — ACETAMINOPHEN 500 MG PO TABS
1000.0000 mg | ORAL_TABLET | Freq: Once | ORAL | Status: AC
  Administered 2016-11-21: 1000 mg via ORAL
  Filled 2016-11-21: qty 2

## 2016-11-21 MED ORDER — IBUPROFEN 800 MG PO TABS
ORAL_TABLET | ORAL | Status: AC
  Filled 2016-11-21: qty 1

## 2016-11-21 MED ORDER — DEXAMETHASONE SODIUM PHOSPHATE 10 MG/ML IJ SOLN
10.0000 mg | Freq: Once | INTRAMUSCULAR | Status: AC
  Administered 2016-11-21: 10 mg via INTRAMUSCULAR
  Filled 2016-11-21: qty 1

## 2016-11-21 MED ORDER — PENICILLIN G BENZATHINE 1200000 UNIT/2ML IM SUSP
1.2000 10*6.[IU] | Freq: Once | INTRAMUSCULAR | Status: AC
  Administered 2016-11-21: 1.2 10*6.[IU] via INTRAMUSCULAR
  Filled 2016-11-21: qty 2

## 2016-11-21 NOTE — ED Notes (Signed)
Pt verbalizes understanding of d/c instructions and denies any further need at this time. 

## 2016-11-21 NOTE — ED Provider Notes (Signed)
[facility]   CSN: [id number] Arrival date & time: 11/21/16  1903   By signing my name below, I, Joel Martinez, attest that this documentation has been prepared under the direction and in the presence of Joel Loll, PA-C. Electronically Signed: Talbert Martinez, Scribe. 11/21/16. 8:36 PM.    History   Chief Complaint Chief Complaint  Patient presents with  . Cough  . Sore Throat    HPI Joel Martinez is a 21 y.o. male who presents to the Emergency Department complaining of moderate, constant generalized body aches. Pt has associated sore throat. Pt has not taken any medications at home PTA. Pt denies rhinorrhea, ear pain, cough, emesis. NKDA. Pt was given Tylenol and Ibuprofen in the ED with slight relief.   The history is provided by the patient. No language interpreter was used.    History reviewed. No pertinent past medical history.  Patient Active Problem List   Diagnosis Date Noted  . Right shoulder injury, subsequent encounter 07/11/2016    History reviewed. No pertinent surgical history.     Home Medications    Prior to Admission medications   Medication Sig Start Date End Date Taking? Authorizing Provider  naproxen (NAPROSYN) 250 MG tablet Take 1 tablet (250 mg total) by mouth 2 (two) times daily with a meal. 09/15/16   Everlene Farrier, PA-C    Family History No family history on file.  Social History Social History  Substance Use Topics  . Smoking status: Never Smoker  . Smokeless tobacco: Never Used  . Alcohol use No     Allergies   Patient has no known allergies.   Review of Systems Review of Systems  HENT: Positive for sore throat. Negative for ear pain and rhinorrhea.   Respiratory: Negative for cough.   Gastrointestinal: Negative for vomiting.  Musculoskeletal: Positive for arthralgias and myalgias.     Physical Exam Updated Vital Signs BP 102/62 (BP Location: Left Arm)   Pulse (!) 112   Temp (!) 101.5 F  (38.6 C) (Oral)   Resp 18   Ht  (1.702 m)   Wt 173 lb (78.5 kg)   SpO2 100%   BMI 27.10 kg/m   Physical Exam  Constitutional: He is oriented to person, place, and time. He appears well-developed and well-nourished.  HENT:  Head: Normocephalic and atraumatic.  No trismus, no uvula deviation, 2+ tonsil bilaterally with exudates and erythema.managing secretions and managing airway.  Neck: Normal range of motion. Neck supple.  Cardiovascular: Regular rhythm.   tachycardia  Pulmonary/Chest: Effort normal and breath sounds normal. No respiratory distress. He has no wheezes. He has no rales. He exhibits no tenderness.  Lymphadenopathy:    He has no cervical adenopathy.  Neurological: He is alert and oriented to person, place, and time.  Skin: Skin is warm and dry.  Psychiatric: He has a normal mood and affect.  Nursing note and vitals reviewed.    ED Treatments / Results   DIAGNOSTIC STUDIES: Oxygen Saturation is 100% on room air, normal by my interpretation.    COORDINATION OF CARE: 8:34 PM Discussed treatment plan with pt at bedside and pt agreed to plan, which includes a shot of penicillin and steroids.    Labs (all labs ordered are listed, but only abnormal results are displayed) Labs Reviewed  RAPID STREP SCREEN (NOT AT [facility])  CULTURE, GROUP A STREP [facility])    EKG  EKG Interpretation None       Radiology No results found.  Procedures Procedures (including critical care time)  Medications Ordered in ED Medications  acetaminophen (TYLENOL) tablet 1,000 mg (1,000 mg Oral Given 11/21/16 1915)  ibuprofen (ADVIL,MOTRIN) tablet 800 mg (800 mg Oral Given 11/21/16 1959)  dexamethasone (DECADRON) injection 10 mg (10 mg Intramuscular Given 11/21/16 2044)  penicillin g benzathine (BICILLIN LA) 1200000 UNIT/2ML injection 1.2 Million Units (1.2 Million Units Intramuscular Given 11/21/16 2042)     Initial Impression / Assessment and Plan / ED Course  I have reviewed  the triage vital signs and the nursing notes.  Pertinent labs & imaging results that were available during my care of the patient were reviewed by me and considered in my medical decision making (see chart for details).     Pt febrile with tonsillar exudate, cervical lymphadenopathy, & dysphagia; diagnosis of strep. Treated in the Ed with steroids, NSAIDs, Pain medication and PCN IM.  Pt appears mildly dehydrated, discussed importance of water rehydration. Presentation non concerning for PTA or infxn spread to soft tissue. No trismus or uvula deviation. Specific return precautions discussed. Pt able to drink water in ED without difficulty with intact air way. Recommended PCP follow up.    Final Clinical Impressions(s) / ED Diagnoses   Final diagnoses:  Pharyngitis, unspecified etiology    New Prescriptions Discharge Medication List as of 11/21/2016  9:10 PM     I personally performed the services described in this documentation, which was scribed in my presence. The recorded information has been reviewed and is accurate.     Joel Mu, PA-C 11/23/16 1403    Joel Brim Tegeler, MD 11/23/16 [phone]

## 2016-11-21 NOTE — ED Triage Notes (Signed)
Cough, sore throat, chills, and fever since last night.

## 2016-11-21 NOTE — Discharge Instructions (Signed)
Your strep test was negative. However I have treated with antibiotics to cover for strep. This is likely a viral illness. Make sure you're alternating Motrin and Tylenol at home for fever. Make sure you're drinking plenty of fluids. Return to the ED if he develops any worsening symptoms.

## 2016-11-23 ENCOUNTER — Emergency Department (HOSPITAL_BASED_OUTPATIENT_CLINIC_OR_DEPARTMENT_OTHER)
Admission: EM | Admit: 2016-11-23 | Discharge: 2016-11-23 | Disposition: A | Discharge status: Home / Self Care | Payer: Self-pay | Attending: Emergency Medicine | Admitting: Emergency Medicine

## 2016-11-23 ENCOUNTER — Encounter (HOSPITAL_BASED_OUTPATIENT_CLINIC_OR_DEPARTMENT_OTHER): Payer: Self-pay | Admitting: *Deleted

## 2016-11-23 DIAGNOSIS — Z791 Long term (current) use of non-steroidal anti-inflammatories (NSAID): Secondary | ICD-10-CM | POA: Insufficient documentation

## 2016-11-23 DIAGNOSIS — J020 Streptococcal pharyngitis: Secondary | ICD-10-CM | POA: Insufficient documentation

## 2016-11-23 LAB — CULTURE, GROUP A STREP (THRC)

## 2016-11-23 MED ORDER — IBUPROFEN 800 MG PO TABS
800.0000 mg | ORAL_TABLET | Freq: Once | ORAL | Status: AC
  Administered 2016-11-23: 800 mg via ORAL
  Filled 2016-11-23: qty 1

## 2016-11-23 MED ORDER — ACETAMINOPHEN 500 MG PO TABS
1000.0000 mg | ORAL_TABLET | Freq: Once | ORAL | Status: AC
  Administered 2016-11-23: 1000 mg via ORAL
  Filled 2016-11-23: qty 2

## 2016-11-23 NOTE — ED Triage Notes (Signed)
Pt c/o cont sore throat , seen here 2 days ago for same

## 2016-11-23 NOTE — Discharge Instructions (Signed)
Take tylenol 2 pills 4 times a day and motrin 4 pills 3 times a day.  Drink plenty of fluids.  Return for inability to swallow, and you started drooling your own spit, and you can't rotate your neck. Follow up with your family doctor.

## 2016-11-23 NOTE — ED Provider Notes (Signed)
[facility]   CSN: [id number] Arrival date & time: 11/23/16  2110  By signing my name below, I, Linna Darner, attest that this documentation has been prepared under the direction and in the presence of physician practitioner, Melene Plan, DO. Electronically Signed: Linna Darner, Scribe. 11/23/2016. 9:33 PM.  History   Chief Complaint Chief Complaint  Patient presents with  . Sore Throat    The history is provided by the patient. No language interpreter was used.  Sore Throat  This is a new problem. The current episode started more than 2 days ago. The problem occurs constantly. The problem has been gradually worsening. Pertinent negatives include no chest pain, no abdominal pain, no headaches and no shortness of breath. The symptoms are aggravated by swallowing. Nothing relieves the symptoms. He has tried acetaminophen for the symptoms. The treatment provided no relief.     HPI Comments: Joel Martinez is a 21 y.o. male who presents to the Emergency Department complaining of a constant, gradually worsening sore throat for a few days. He was seen here on 4/23 for the same and diagnosed with strep throat. Pt was treated with penicillin and Decadron in the ED which he states have not improved his sore throat. He has also tried Tylenol without improvement of his sore throat. He endorses exacerbation of his sore throat with swallowing. Pt denies trouble swallowing, drooling, neck pain/stiffness, dental pain, fevers, chills, or any other associated symptoms.  History reviewed. No pertinent past medical history.  Patient Active Problem List   Diagnosis Date Noted  . Right shoulder injury, subsequent encounter 07/11/2016    History reviewed. No pertinent surgical history.     Home Medications    Prior to Admission medications   Medication Sig Start Date End Date Taking? Authorizing Provider  naproxen (NAPROSYN) 250 MG tablet Take 1 tablet (250 mg total) by  mouth 2 (two) times daily with a meal. 09/15/16   Everlene Farrier, PA-C    Family History History reviewed. No pertinent family history.  Social History Social History  Substance Use Topics  . Smoking status: Never Smoker  . Smokeless tobacco: Never Used  . Alcohol use No     Allergies   Patient has no known allergies.   Review of Systems Review of Systems  Constitutional: Negative for chills and fever.  HENT: Positive for sore throat. Negative for congestion, dental problem, drooling, facial swelling and trouble swallowing.   Eyes: Negative for discharge and visual disturbance.  Respiratory: Negative for shortness of breath.   Cardiovascular: Negative for chest pain and palpitations.  Gastrointestinal: Negative for abdominal pain, diarrhea and vomiting.  Musculoskeletal: Negative for arthralgias, myalgias, neck pain and neck stiffness.  Skin: Negative for color change and rash.  Neurological: Negative for tremors, syncope and headaches.  Psychiatric/Behavioral: Negative for confusion and dysphoric mood.  All other systems reviewed and are negative.  Physical Exam Updated Vital Signs BP 112/71   Pulse (!) 110   Temp 99.2 F (37.3 C) (Oral)   Resp 18   Wt 173 lb (78.5 kg)   SpO2 99%   BMI 27.10 kg/m   Physical Exam  Constitutional: He is oriented to person, place, and time. He appears well-developed and well-nourished.  HENT:  Head: Normocephalic and atraumatic.  Mouth/Throat: Uvula is midline. Posterior oropharyngeal erythema present. Tonsils are 2+ on the right. Tonsils are 2+ on the left. Tonsillar exudate.  Bilateral tonsillar swelling with exudates. Uvula is midline. Tolerating own secretions. Able to rotate  head back and forth without difficulty. No sublingual swelling.   Eyes: EOM are normal. Pupils are equal, round, and reactive to light.  Neck: Normal range of motion. Neck supple. No JVD present.  Cardiovascular: Normal rate and regular rhythm.  Exam reveals  no gallop and no friction rub.   No murmur heard. Pulmonary/Chest: No respiratory distress. He has no wheezes.  Abdominal: He exhibits no distension. There is no rebound and no guarding.  Musculoskeletal: Normal range of motion.  Neurological: He is alert and oriented to person, place, and time.  Skin: No rash noted. No pallor.  Psychiatric: He has a normal mood and affect. His behavior is normal.  Nursing note and vitals reviewed.  ED Treatments / Results  Labs (all labs ordered are listed, but only abnormal results are displayed) Labs Reviewed - No data to display  EKG  EKG Interpretation None       Radiology No results found.  Procedures Procedures (including critical care time)  DIAGNOSTIC STUDIES: Oxygen Saturation is 99% on RA, normal by my interpretation.    COORDINATION OF CARE: 9:39 PM Discussed treatment plan with pt at bedside and pt agreed to plan.  Medications Ordered in ED Medications  acetaminophen (TYLENOL) tablet 1,000 mg (1,000 mg Oral Given 11/23/16 2143)  ibuprofen (ADVIL,MOTRIN) tablet 800 mg (800 mg Oral Given 11/23/16 2143)     Initial Impression / Assessment and Plan / ED Course  I have reviewed the triage vital signs and the nursing notes.  Pertinent labs & imaging results that were available during my care of the patient were reviewed by me and considered in my medical decision making (see chart for details).     21 yo M With a chief complaint of sore throat. Patient was seen for the same couple days ago. At that point was found to have strep and given Bicillin injection. On my exam patient with continued enlarged tonsils with exudates. No signs of complicated infection. Tolerating his secretions well-appearing and nontoxic. Afebrile here in the ED. Feel this may be viral and the patient is colonized with strep will have him continue symptomatic therapy. PCP follow-up.  11:04 PM:  I have discussed the diagnosis/risks/treatment options with the  patient and family and believe the pt to be eligible for discharge home to follow-up with PCP. We also discussed returning to the ED immediately if new or worsening sx occur. We discussed the sx which are most concerning (e.g., sudden worsening pain, fever, inability to tolerate by mouth) that necessitate immediate return. Medications administered to the patient during their visit and any new prescriptions provided to the patient are listed below.  Medications given during this visit Medications  acetaminophen (TYLENOL) tablet 1,000 mg (1,000 mg Oral Given 11/23/16 2143)  ibuprofen (ADVIL,MOTRIN) tablet 800 mg (800 mg Oral Given 11/23/16 2143)     The patient appears reasonably screen and/or stabilized for discharge and I doubt any other medical condition or other [facility] requiring further screening, evaluation, or treatment in the ED at this time prior to discharge.    Final Clinical Impressions(s) / ED Diagnoses   Final diagnoses:  Strep pharyngitis    New Prescriptions Discharge Medication List as of 11/23/2016  9:40 PM      I personally performed the services described in this documentation, which was scribed in my presence. The recorded information has been reviewed and is accurate.    Melene Plan, DO 11/23/16 2304

## 2016-12-11 ENCOUNTER — Encounter (HOSPITAL_BASED_OUTPATIENT_CLINIC_OR_DEPARTMENT_OTHER): Payer: Self-pay | Admitting: Emergency Medicine

## 2016-12-11 ENCOUNTER — Emergency Department (HOSPITAL_BASED_OUTPATIENT_CLINIC_OR_DEPARTMENT_OTHER)
Admission: EM | Admit: 2016-12-11 | Discharge: 2016-12-11 | Disposition: A | Discharge status: Home / Self Care | Payer: Self-pay | Attending: Emergency Medicine | Admitting: Emergency Medicine

## 2016-12-11 DIAGNOSIS — J029 Acute pharyngitis, unspecified: Secondary | ICD-10-CM | POA: Insufficient documentation

## 2016-12-11 DIAGNOSIS — R1110 Vomiting, unspecified: Secondary | ICD-10-CM | POA: Insufficient documentation

## 2016-12-11 DIAGNOSIS — R1310 Dysphagia, unspecified: Secondary | ICD-10-CM | POA: Insufficient documentation

## 2016-12-11 MED ORDER — RANITIDINE HCL 150 MG PO CAPS: 150.0000 mg | ORAL_CAPSULE | Freq: Every day | ORAL | 0 refills | Status: AC

## 2016-12-11 NOTE — ED Notes (Signed)
Pt describes his symptoms food getting caught in his throat and then having to throw it up. Pt reports constant urge to spit. Pt states he even has the sensation when drinking liquids. Symptoms present x2 weeks. Pt denies sore throat, denies nausea.

## 2016-12-11 NOTE — ED Provider Notes (Signed)
[facility]   CSN: [id number] Arrival date & time: 12/11/16  1348     History   Chief Complaint Chief Complaint  Patient presents with  . GI Problem    HPI Milinda Caveyler Guidice is a 21 y.o. male.  HPI   Presents with problems swallowing and vomiting after swallowing.  Had strep throat, sore throat improved, then difficulty swallowing developed. 2 weeks of symptoms. Worse with liquids, solids, anything.  Feels like gets stuck in back of throat then vomit.  No nausea. Just vomiting.  Not really drooling, no dysphonia, no stridor, no dyspnea. Mouth feels dry.  Not particular moment when it happened but noticed it while eating subway. Feels like it is stuck in the back of the throat.  Not coming in episodes but is every time he eats or drinks.   History reviewed. No pertinent past medical history.  Patient Active Problem List   Diagnosis Date Noted  . Right shoulder injury, subsequent encounter 07/11/2016    History reviewed. No pertinent surgical history.     Home Medications    Prior to Admission medications   Medication Sig Start Date End Date Taking? Authorizing Provider  naproxen (NAPROSYN) 250 MG tablet Take 1 tablet (250 mg total) by mouth 2 (two) times daily with a meal. 09/15/16   Everlene Farrieransie, William, PA-C  ranitidine (ZANTAC) 150 MG capsule Take 1 capsule (150 mg total) by mouth daily. 12/11/16   Alvira MondaySchlossman, Lilyana Lippman, MD    Family History No family history on file.  Social History Social History  Substance Use Topics  . Smoking status: Never Smoker  . Smokeless tobacco: Never Used  . Alcohol use No     Allergies   Patient has no known allergies.   Review of Systems Review of Systems  Constitutional: Negative for fever.  HENT: Negative for sore throat.   Eyes: Negative for visual disturbance.  Respiratory: Negative for shortness of breath.   Cardiovascular: Negative for chest pain.  Gastrointestinal: Positive for vomiting. Negative for  abdominal pain and nausea.  Genitourinary: Negative for difficulty urinating.  Musculoskeletal: Negative for back pain and neck stiffness.  Skin: Negative for rash.  Neurological: Negative for syncope and headaches.     Physical Exam Updated Vital Signs BP 120/86 (BP Location: Right Arm)   Pulse 86   Temp 99.7 F (37.6 C) (Oral)   Resp 16   Ht 5\' 7"  (1.702 m)   Wt 150 lb 8 oz (68.3 kg)   SpO2 100%   BMI 23.57 kg/m   Physical Exam  Constitutional: He is oriented to person, place, and time. He appears well-developed and well-nourished. No distress.  HENT:  Head: Normocephalic and atraumatic.  Mouth/Throat: Oropharyngeal exudate present.     Eyes: Conjunctivae and EOM are normal.  Neck: Normal range of motion.  Cardiovascular: Normal rate, regular rhythm, normal heart sounds and intact distal pulses.  Exam reveals no gallop and no friction rub.   No murmur heard. Pulmonary/Chest: Effort normal and breath sounds normal. No respiratory distress. He has no wheezes. He has no rales.  Abdominal: Soft. He exhibits no distension. There is no tenderness. There is no guarding.  Musculoskeletal: He exhibits no edema.  Neurological: He is alert and oriented to person, place, and time.  Skin: Skin is warm and dry. He is not diaphoretic.  Nursing note and vitals reviewed.    ED Treatments / Results  Labs (all labs ordered are listed, but only abnormal results are displayed) Labs  Reviewed - No data to display  EKG  EKG Interpretation None       Radiology No results found.  Procedures Procedures (including critical care time)  Medications Ordered in ED Medications - No data to display   Initial Impression / Assessment and Plan / ED Course  I have reviewed the triage vital signs and the nursing notes.  Pertinent labs & imaging results that were available during my care of the patient were reviewed by me and considered in my medical decision making (see chart for  details).     21yo male presents with concern for difficulty swallowing. Has had multiple ED presentations for similar symptoms, prior sore throat, diagnosed with strep, other pharyngitis.  No sign of peritonsillar abscess, retropharyngeal abscess, epiglottitis.  Patient able to swallow, tolerating po fluids in the ED. Pt had been recommended to see ENT prior, also recommend ENT follow up. Patient discharged in stable condition with understanding of reasons to return.   Final Clinical Impressions(s) / ED Diagnoses   Final diagnoses:  Dysphagia, unspecified type    New Prescriptions Discharge Medication List as of 12/11/2016  3:28 PM    START taking these medications   Details  ranitidine (ZANTAC) 150 MG capsule Take 1 capsule (150 mg total) by mouth daily., Starting Sun 12/11/2016, Print         Alvira Monday, MD 12/11/16 2159

## 2016-12-11 NOTE — ED Triage Notes (Signed)
Pt recently tx for strep throat. Pt states since then every time he eats the food comes right back up. Denies sore throat.

## 2017-11-20 IMAGING — CR DG SHOULDER 2+V*R*
3 series · 3 of 3 positions shown · non-contrast
Comparison: None.

CLINICAL DATA: Pt was reaching for something at work on 06/15/16
and is having rt shoulder pain

EXAM:
RIGHT SHOULDER - 2+ VIEW

[w shoulder grashey right]
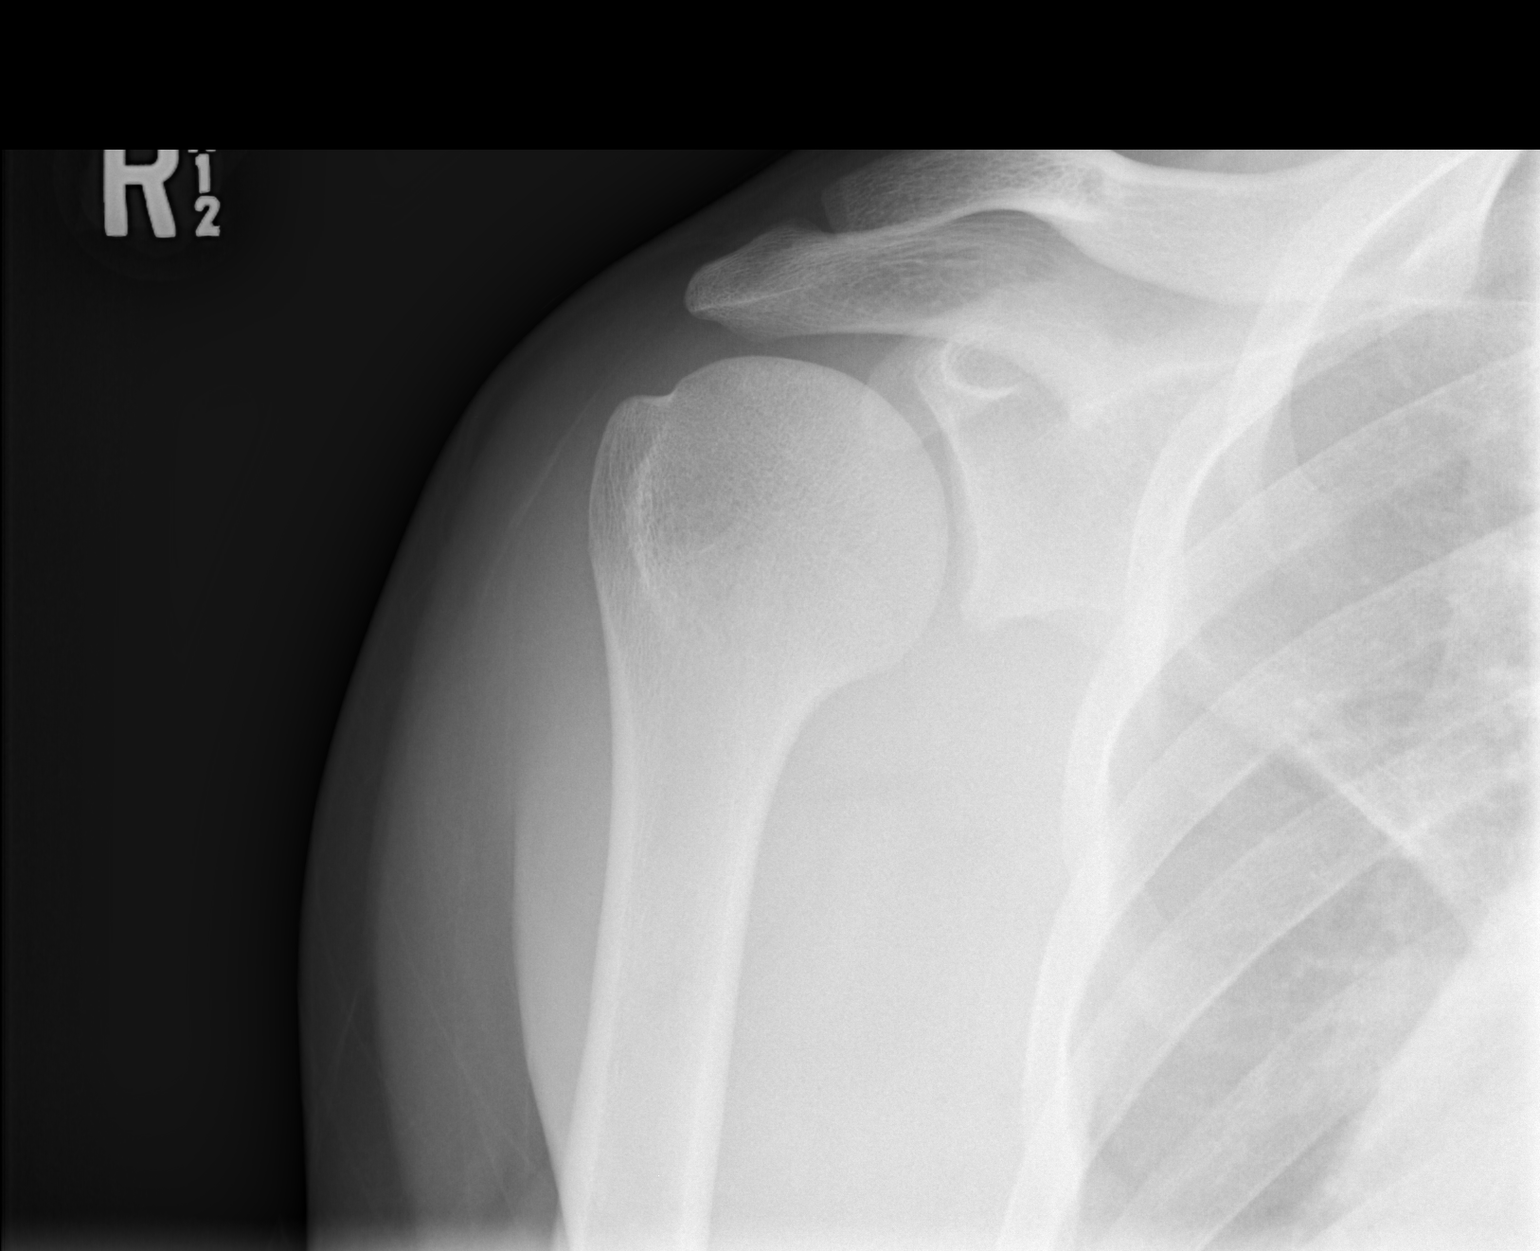

[w shoulder y view right]
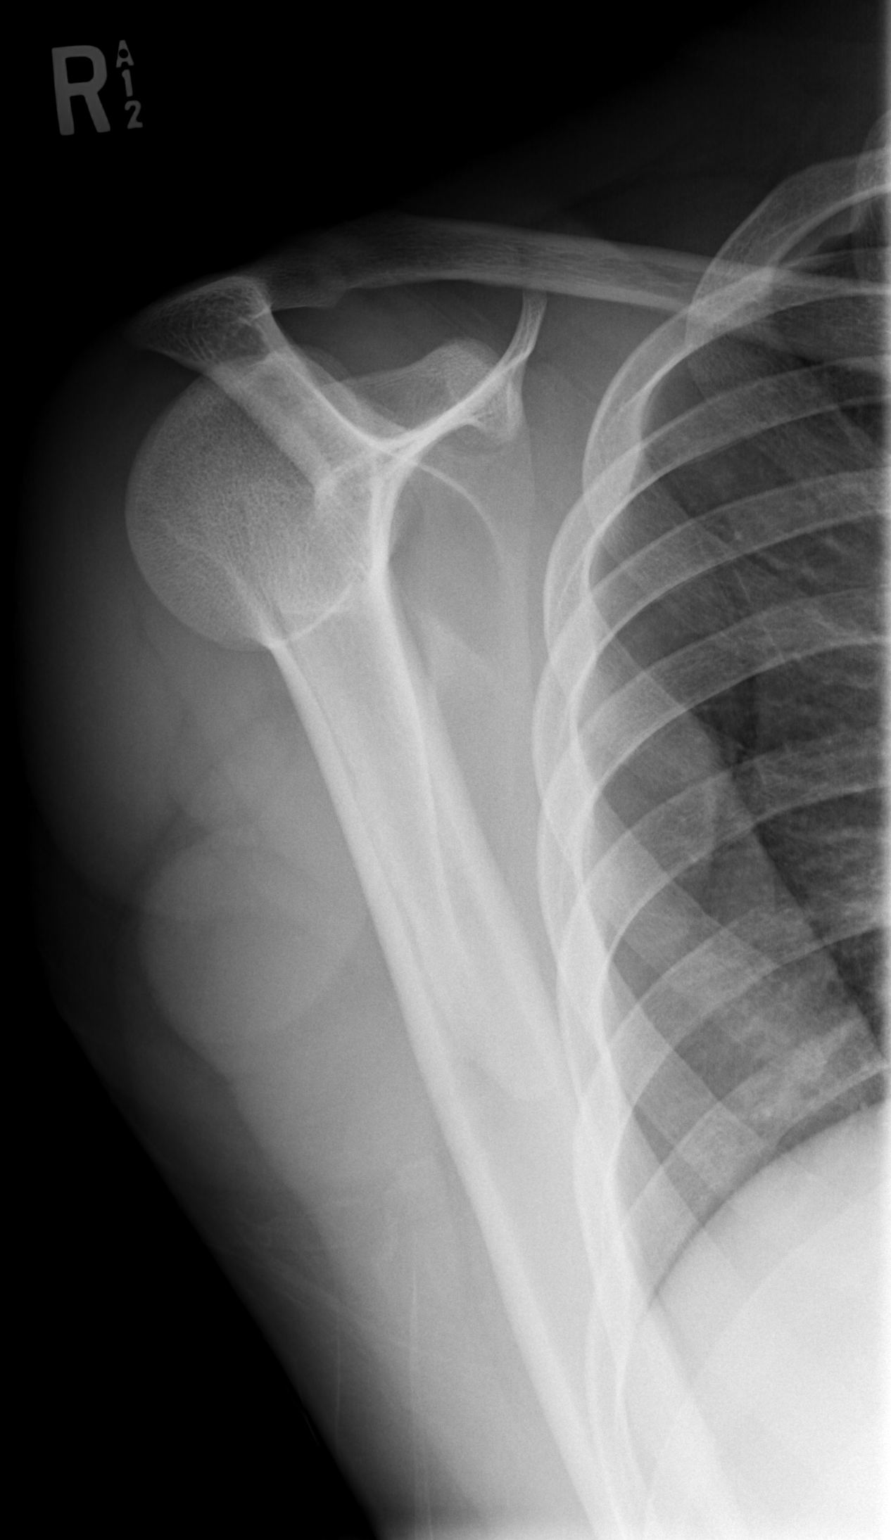

[x shoulder axillary right]
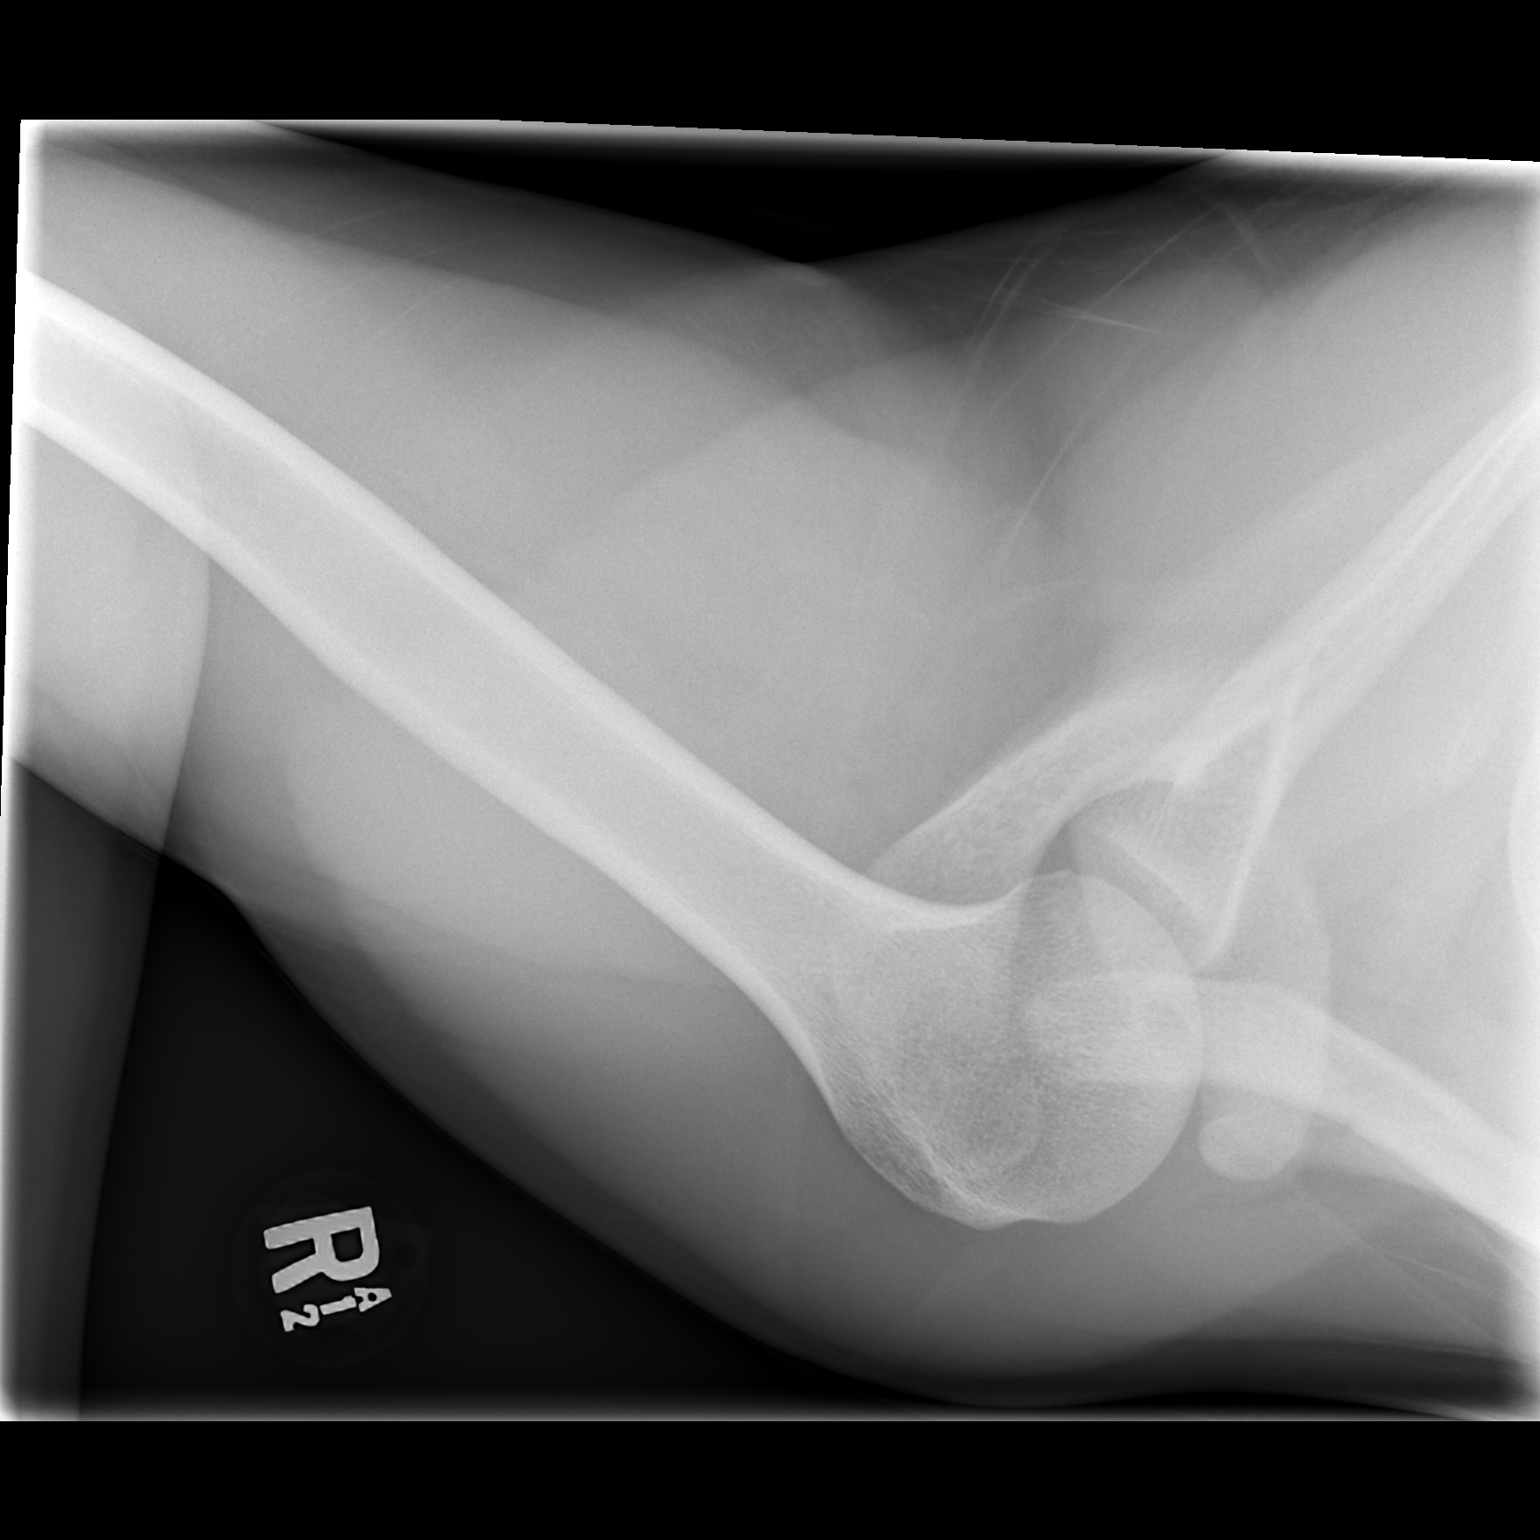

[3 of 3 positions shown; findings below may reference images not displayed]

FINDINGS: There is no evidence of fracture or dislocation. There is no
evidence of arthropathy or other focal bone abnormality. Soft
tissues are unremarkable.
IMPRESSION: Negative.

## 2020-03-15 ENCOUNTER — Emergency Department (HOSPITAL_BASED_OUTPATIENT_CLINIC_OR_DEPARTMENT_OTHER)
Admission: EM | Admit: 2020-03-15 | Discharge: 2020-03-15 | Disposition: A | Discharge status: Home / Self Care | Payer: HRSA Program | Attending: Emergency Medicine | Admitting: Emergency Medicine

## 2020-03-15 ENCOUNTER — Encounter (HOSPITAL_BASED_OUTPATIENT_CLINIC_OR_DEPARTMENT_OTHER): Payer: Self-pay | Admitting: Emergency Medicine

## 2020-03-15 ENCOUNTER — Other Ambulatory Visit: Payer: Self-pay

## 2020-03-15 DIAGNOSIS — U071 COVID-19: Secondary | ICD-10-CM

## 2020-03-15 DIAGNOSIS — R509 Fever, unspecified: Secondary | ICD-10-CM | POA: Diagnosis present

## 2020-03-15 LAB — SARS CORONAVIRUS 2 BY RT PCR (HOSPITAL ORDER, PERFORMED IN ~~LOC~~ HOSPITAL LAB): SARS Coronavirus 2: POSITIVE — AB

## 2020-03-15 NOTE — ED Provider Notes (Signed)
[facility] EMERGENCY DEPARTMENT Provider Note  CSN: [id number] Arrival date & time: 03/15/20 1421    History Chief Complaint  Patient presents with  . Fever  . Generalized Body Aches    HPI  Joel Martinez is a 24 y.o. male with no significant PMH reports he has had chills and body aches since last night, improved with Nyquil. No cough or SOB today. He has had J&J vaccine, no known sick contacts.    History reviewed. No pertinent past medical history.  History reviewed. No pertinent surgical history.  History reviewed. No pertinent family history.  Social History   Tobacco Use  . Smoking status: Never Smoker  . Smokeless tobacco: Never Used  Substance Use Topics  . Alcohol use: No  . Drug use: No     Home Medications Prior to Admission medications   Medication Sig Start Date End Date Taking? Authorizing Provider  naproxen (NAPROSYN) 250 MG tablet Take 1 tablet (250 mg total) by mouth 2 (two) times daily with a meal. 09/15/16   Everlene Farrier, PA-C  ranitidine (ZANTAC) 150 MG capsule Take 1 capsule (150 mg total) by mouth daily. 12/11/16   Alvira Monday, MD     Allergies    Patient has no known allergies.   Review of Systems   Review of Systems A comprehensive review of systems was completed and negative except as noted in HPI.   Physical Exam BP (!) 134/91   Pulse (!) 109   Temp 98.9 F (37.2 C)   Resp 18   SpO2 99%   Physical Exam Vitals and nursing note reviewed.  Constitutional:      Appearance: Normal appearance.  HENT:     Head: Normocephalic and atraumatic.     Nose: Nose normal.     Mouth/Throat:     Mouth: Mucous membranes are moist.  Eyes:     Extraocular Movements: Extraocular movements intact.     Conjunctiva/sclera: Conjunctivae normal.  Cardiovascular:     Rate and Rhythm: Normal rate.  Pulmonary:     Effort: Pulmonary effort is normal.     Breath sounds: Normal breath sounds.  Abdominal:     General: Abdomen is  flat.     Palpations: Abdomen is soft.     Tenderness: There is no abdominal tenderness.  Musculoskeletal:        General: No swelling. Normal range of motion.     Cervical back: Neck supple.  Skin:    General: Skin is warm and dry.  Neurological:     General: No focal deficit present.     Mental Status: He is alert.  Psychiatric:        Mood and Affect: Mood normal.      ED Results / Procedures / Treatments   Labs (all labs ordered are listed, but only abnormal results are displayed) Labs Reviewed  SARS CORONAVIRUS 2 BY RT PCR (HOSPITAL ORDER, PERFORMED IN [facility]) - Abnormal; Notable for the following components:      Result Value   SARS Coronavirus 2 POSITIVE (*)    All other components within normal limits    EKG None   Radiology No results found.  Procedures Procedures  Medications Ordered in the ED Medications - No data to display   MDM Rules/Calculators/A&P MDM Patient well appearing, nontoxic with normal vitals. Covid is positive. Patient given standard quarantine instructions. Advised to get a home pulse oximeter and RTED for any worsening particularly with hypoxia. Patient  is not a candidate for monoclonal antibody treatment.   Joel Martinez was evaluated in Emergency Department on 03/15/2020 for the symptoms described in the history of present illness. He was evaluated in the context of the global COVID-19 pandemic, which necessitated consideration that the patient might be at risk for infection with the SARS-CoV-2 virus that causes COVID-19. Institutional protocols and algorithms that pertain to the evaluation of patients at risk for COVID-19 are in a state of rapid change based on information released by regulatory bodies including the CDC and federal and state organizations. These policies and algorithms were followed during the patient's care in the ED.  ED Course  I have reviewed the triage vital signs and the nursing notes.  Pertinent  labs & imaging results that were available during my care of the patient were reviewed by me and considered in my medical decision making (see chart for details).     Final Clinical Impression(s) / ED Diagnoses Final diagnoses:  COVID-19    Rx / DC Orders ED Discharge Orders    None       Pollyann Savoy, MD 03/15/20 1727

## 2020-03-15 NOTE — ED Triage Notes (Signed)
PT here with fever and body aches. States he got J&J vaccine, but doesn't remember when.

## 2021-06-06 ENCOUNTER — Other Ambulatory Visit: Payer: Self-pay

## 2021-06-06 ENCOUNTER — Emergency Department (HOSPITAL_BASED_OUTPATIENT_CLINIC_OR_DEPARTMENT_OTHER)
Admission: EM | Admit: 2021-06-06 | Discharge: 2021-06-06 | Disposition: A | Discharge status: Home / Self Care | Payer: Medicaid Other | Attending: Emergency Medicine | Admitting: Emergency Medicine

## 2021-06-06 ENCOUNTER — Encounter (HOSPITAL_BASED_OUTPATIENT_CLINIC_OR_DEPARTMENT_OTHER): Payer: Self-pay | Admitting: Emergency Medicine

## 2021-06-06 DIAGNOSIS — J029 Acute pharyngitis, unspecified: Secondary | ICD-10-CM | POA: Insufficient documentation

## 2021-06-06 DIAGNOSIS — Z20822 Contact with and (suspected) exposure to covid-19: Secondary | ICD-10-CM | POA: Insufficient documentation

## 2021-06-06 LAB — GROUP A STREP BY PCR: Group A Strep by PCR: NOT DETECTED

## 2021-06-06 LAB — RAPID INFLUENZA A&B ANTIGENS
Influenza A (ARMC): NEGATIVE
Influenza B (ARMC): NEGATIVE

## 2021-06-06 MED ORDER — KETOROLAC TROMETHAMINE 15 MG/ML IJ SOLN
15.0000 mg | Freq: Once | INTRAMUSCULAR | Status: AC
  Administered 2021-06-06: 15 mg via INTRAMUSCULAR
  Filled 2021-06-06: qty 1

## 2021-06-06 NOTE — ED Triage Notes (Signed)
Pt arrives pov with c/o sore throat that started last night. Pt endorses ability to swallow secretions, Took 400 mg at 0430 today

## 2021-06-06 NOTE — Discharge Instructions (Addendum)
You were seen in the emergency department today for sore throat.  Your strep and flu testing were negative.  Your COVID test is still pending, and this should result in the next day or so.  You can follow-up the results on MyChart like we discussed.    There are several viruses that can cause similar symptoms.  One of these is called mononucleosis, but testing for this so early in the disease course is usually not very beneficial.  However if you continue to have symptoms going on for about a week or more, I would follow-up with your primary doctor or go to urgent care for Monospot testing.  We gave you a dose of an anti-inflammatory medicine which should help with some of your symptoms.  Overall would like you to continue taking Tylenol or ibuprofen at home.  You also can drink warm tea, use cough drops or cough syrup to soothe your sore throat.  I recommend drinking lots of water and getting plenty of rest.

## 2021-06-06 NOTE — ED Provider Notes (Signed)
[facility] EMERGENCY DEPARTMENT Provider Note   CSN: [id number] Arrival date & time: 06/06/21  4010     History No chief complaint on file.   Joel Martinez is a 25 y.o. male with no significant past medical history who presents to the emergency department with sore throat beginning at 7 PM last night.  Patient states that he is having intermittent difficulty swallowing, but is overall tolerating p.o. well.  He took some ibuprofen at about 430 this morning, with some relief.  No fevers, chills, chest pain, shortness of breath, nasal congestion, cough, abdominal pain, nausea, vomiting, diarrhea.  HPI     History reviewed. No pertinent past medical history.  Patient Active Problem List   Diagnosis Date Noted   Right shoulder injury, subsequent encounter 07/11/2016    History reviewed. No pertinent surgical history.     History reviewed. No pertinent family history.  Social History   Tobacco Use   Smoking status: Never   Smokeless tobacco: Never  Substance Use Topics   Alcohol use: No   Drug use: No    Home Medications Prior to Admission medications   Medication Sig Start Date End Date Taking? Authorizing Provider  naproxen (NAPROSYN) 250 MG tablet Take 1 tablet (250 mg total) by mouth 2 (two) times daily with a meal. 09/15/16   Everlene Farrier, PA-C  ranitidine (ZANTAC) 150 MG capsule Take 1 capsule (150 mg total) by mouth daily. 12/11/16   Alvira Monday, MD    Allergies    Patient has no known allergies.  Review of Systems   Review of Systems  Constitutional:  Negative for chills and fever.  HENT:  Positive for sore throat and trouble swallowing. Negative for congestion, ear pain, rhinorrhea and voice change.   Respiratory:  Negative for cough, choking, shortness of breath and wheezing.   Cardiovascular:  Negative for chest pain.  Gastrointestinal:  Negative for abdominal pain, constipation, diarrhea, nausea and vomiting.  Musculoskeletal:  Negative  for myalgias.  Neurological:  Negative for headaches.  All other systems reviewed and are negative.  Physical Exam Updated Vital Signs BP (!) 144/83 (BP Location: Left Arm)   Pulse (!) 105   Temp 100.1 F (37.8 C) (Oral)   Resp 18   Ht 5\' 7"  (1.702 m)   Wt 91.2 kg   SpO2 96%   BMI 31.48 kg/m   Physical Exam Vitals and nursing note reviewed.  Constitutional:      Appearance: Normal appearance.  HENT:     Head: Normocephalic and atraumatic.     Jaw: No trismus.     Nose: Nose normal.     Mouth/Throat:     Mouth: Mucous membranes are moist.     Pharynx: Oropharynx is clear. Posterior oropharyngeal erythema present. No oropharyngeal exudate.     Tonsils: No tonsillar abscesses.  Eyes:     Conjunctiva/sclera: Conjunctivae normal.  Neck:     Trachea: Trachea and phonation normal.  Cardiovascular:     Rate and Rhythm: Normal rate and regular rhythm.  Pulmonary:     Effort: Pulmonary effort is normal. No respiratory distress.     Breath sounds: Normal breath sounds.  Abdominal:     General: There is no distension.     Palpations: Abdomen is soft.     Tenderness: There is no abdominal tenderness.  Musculoskeletal:     Cervical back: Neck supple.  Lymphadenopathy:     Cervical: Cervical adenopathy present.     Right cervical: No superficial,  deep or posterior cervical adenopathy.    Left cervical: Superficial cervical adenopathy present. No deep or posterior cervical adenopathy.  Skin:    General: Skin is warm and dry.  Neurological:     General: No focal deficit present.     Mental Status: He is alert.    ED Results / Procedures / Treatments   Labs (all labs ordered are listed, but only abnormal results are displayed) Labs Reviewed  RAPID INFLUENZA A&B ANTIGENS  GROUP A STREP BY PCR  SARS CORONAVIRUS 2 (TAT 6-24 HRS)    EKG None  Radiology No results found.  Procedures Procedures   Medications Ordered in ED Medications  ketorolac (TORADOL) 15 MG/ML  injection 15 mg (15 mg Intramuscular Given 06/06/21 1249)    ED Course  I have reviewed the triage vital signs and the nursing notes.  Pertinent labs & imaging results that were available during my care of the patient were reviewed by me and considered in my medical decision making (see chart for details).    MDM Rules/Calculators/A&P                           Is otherwise healthy 25 year old male who presents emergency department with sore throat since 7 PM last night.  He states that he has some pain with swallowing, but is overall tolerating p.o. well.  No fevers, chills, chest pain, shortness of breath, abdominal pain, nausea, vomiting, diarrhea.  On my exam patient is afebrile, not hypoxic, no acute distress.  Oropharynx with mild erythema, no exudate or tonsillar swelling.  Mild left-sided cervical lymphadenopathy, no posterior adenopathy.  No concern for peritonsillar abscess or Ludwig's angina.  Lung sounds are clear to auscultation in all fields.  Patient given 1 dose of Toradol with mild improvement.  Strep and testing were negative, COVID test is still pending.  Wells criteria is low risk for PE.  Overall patient is clinically very well-appearing, and is not requiring admission or inpatient treatment for his symptoms at this time.  Plan to discharge to home with symptomatic management and over-the-counter medications.  Discussed reasons to return to the emergency department.  Patient agreeable to plan.  Final Clinical Impression(s) / ED Diagnoses Final diagnoses:  Sore throat    Rx / DC Orders ED Discharge Orders     None      Portions of this report may have been transcribed using voice recognition software. Every effort was made to ensure accuracy; however, inadvertent computerized transcription errors may be present.    Jeanella Flattery 06/06/21 1308    Milagros Loll, MD 06/08/21 1108

## 2021-06-07 LAB — SARS CORONAVIRUS 2 (TAT 6-24 HRS): SARS Coronavirus 2: NEGATIVE

## 2021-12-13 ENCOUNTER — Other Ambulatory Visit: Payer: Self-pay
